# Patient Record
Sex: Female | Born: 1985 | Hispanic: No | Marital: Single | Smoking: Former smoker
Health system: Southern US, Community
[De-identification: ages and names within clinical notes are randomized; demographics above are authoritative.]

## PROBLEM LIST (undated history)

## (undated) ENCOUNTER — Inpatient Hospital Stay (HOSPITAL_COMMUNITY): Payer: Self-pay

## (undated) DIAGNOSIS — Z915 Personal history of self-harm: Secondary | ICD-10-CM

## (undated) DIAGNOSIS — F329 Major depressive disorder, single episode, unspecified: Secondary | ICD-10-CM

## (undated) DIAGNOSIS — E079 Disorder of thyroid, unspecified: Secondary | ICD-10-CM

## (undated) DIAGNOSIS — G43909 Migraine, unspecified, not intractable, without status migrainosus: Secondary | ICD-10-CM

## (undated) DIAGNOSIS — K219 Gastro-esophageal reflux disease without esophagitis: Secondary | ICD-10-CM

## (undated) DIAGNOSIS — Z9151 Personal history of suicidal behavior: Secondary | ICD-10-CM

## (undated) DIAGNOSIS — N83209 Unspecified ovarian cyst, unspecified side: Secondary | ICD-10-CM

## (undated) DIAGNOSIS — IMO0002 Reserved for concepts with insufficient information to code with codable children: Secondary | ICD-10-CM

## (undated) DIAGNOSIS — F419 Anxiety disorder, unspecified: Secondary | ICD-10-CM

## (undated) DIAGNOSIS — F32A Depression, unspecified: Secondary | ICD-10-CM

## (undated) DIAGNOSIS — F909 Attention-deficit hyperactivity disorder, unspecified type: Secondary | ICD-10-CM

## (undated) DIAGNOSIS — F319 Bipolar disorder, unspecified: Secondary | ICD-10-CM

## (undated) DIAGNOSIS — F99 Mental disorder, not otherwise specified: Secondary | ICD-10-CM

## (undated) HISTORY — DX: Attention-deficit hyperactivity disorder, unspecified type: F90.9

## (undated) HISTORY — DX: Personal history of suicidal behavior: Z91.51

## (undated) HISTORY — DX: Personal history of self-harm: Z91.5

## (undated) HISTORY — DX: Migraine, unspecified, not intractable, without status migrainosus: G43.909

## (undated) HISTORY — DX: Mental disorder, not otherwise specified: F99

## (undated) HISTORY — PX: BRAIN SURGERY: SHX531

## (undated) HISTORY — PX: OTHER SURGICAL HISTORY: SHX169

---
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
## 2005-01-12 ENCOUNTER — Emergency Department (HOSPITAL_COMMUNITY): Admission: EM | Admit: 2005-01-12 | Discharge: 2005-01-12 | Payer: Self-pay | Admitting: Emergency Medicine

## 2005-01-15 ENCOUNTER — Emergency Department (HOSPITAL_COMMUNITY): Admission: EM | Admit: 2005-01-15 | Discharge: 2005-01-15 | Payer: Self-pay | Admitting: Emergency Medicine

## 2005-04-20 ENCOUNTER — Ambulatory Visit (HOSPITAL_COMMUNITY): Admission: RE | Admit: 2005-04-20 | Discharge: 2005-04-20 | Payer: Self-pay | Admitting: Obstetrics and Gynecology

## 2005-06-02 ENCOUNTER — Inpatient Hospital Stay (HOSPITAL_COMMUNITY): Admission: AD | Admit: 2005-06-02 | Discharge: 2005-06-04 | Payer: Self-pay | Admitting: Obstetrics & Gynecology

## 2005-09-04 ENCOUNTER — Emergency Department (HOSPITAL_COMMUNITY): Admission: EM | Admit: 2005-09-04 | Discharge: 2005-09-05 | Payer: Self-pay | Admitting: Emergency Medicine

## 2005-10-31 ENCOUNTER — Emergency Department (HOSPITAL_COMMUNITY): Admission: EM | Admit: 2005-10-31 | Discharge: 2005-10-31 | Payer: Self-pay | Admitting: Emergency Medicine

## 2006-07-13 ENCOUNTER — Emergency Department (HOSPITAL_COMMUNITY): Admission: EM | Admit: 2006-07-13 | Discharge: 2006-07-13 | Payer: Self-pay | Admitting: Emergency Medicine

## 2006-12-13 ENCOUNTER — Emergency Department (HOSPITAL_COMMUNITY): Admission: EM | Admit: 2006-12-13 | Discharge: 2006-12-14 | Payer: Self-pay | Admitting: Emergency Medicine

## 2006-12-22 ENCOUNTER — Emergency Department (HOSPITAL_COMMUNITY): Admission: EM | Admit: 2006-12-22 | Discharge: 2006-12-22 | Payer: Self-pay | Admitting: Emergency Medicine

## 2007-01-11 ENCOUNTER — Emergency Department (HOSPITAL_COMMUNITY): Admission: EM | Admit: 2007-01-11 | Discharge: 2007-01-11 | Payer: Self-pay | Admitting: Emergency Medicine

## 2007-01-13 ENCOUNTER — Emergency Department (HOSPITAL_COMMUNITY): Admission: EM | Admit: 2007-01-13 | Discharge: 2007-01-13 | Payer: Self-pay | Admitting: Emergency Medicine

## 2007-04-24 ENCOUNTER — Emergency Department (HOSPITAL_COMMUNITY): Admission: EM | Admit: 2007-04-24 | Discharge: 2007-04-24 | Payer: Self-pay | Admitting: Emergency Medicine

## 2007-04-25 ENCOUNTER — Emergency Department (HOSPITAL_COMMUNITY): Admission: EM | Admit: 2007-04-25 | Discharge: 2007-04-25 | Payer: Self-pay | Admitting: Emergency Medicine

## 2007-08-23 ENCOUNTER — Emergency Department (HOSPITAL_COMMUNITY): Admission: EM | Admit: 2007-08-23 | Discharge: 2007-08-23 | Payer: Self-pay | Admitting: Emergency Medicine

## 2007-08-27 ENCOUNTER — Emergency Department (HOSPITAL_COMMUNITY): Admission: EM | Admit: 2007-08-27 | Discharge: 2007-08-27 | Payer: Self-pay | Admitting: *Deleted

## 2007-09-10 ENCOUNTER — Emergency Department (HOSPITAL_COMMUNITY): Admission: EM | Admit: 2007-09-10 | Discharge: 2007-09-10 | Payer: Self-pay | Admitting: Emergency Medicine

## 2007-09-13 ENCOUNTER — Emergency Department (HOSPITAL_COMMUNITY): Admission: EM | Admit: 2007-09-13 | Discharge: 2007-09-13 | Payer: Self-pay | Admitting: Emergency Medicine

## 2007-12-31 ENCOUNTER — Emergency Department (HOSPITAL_COMMUNITY): Admission: EM | Admit: 2007-12-31 | Discharge: 2007-12-31 | Payer: Self-pay | Admitting: Emergency Medicine

## 2008-01-14 ENCOUNTER — Emergency Department (HOSPITAL_COMMUNITY): Admission: EM | Admit: 2008-01-14 | Discharge: 2008-01-14 | Payer: Self-pay | Admitting: Emergency Medicine

## 2008-02-20 ENCOUNTER — Inpatient Hospital Stay (HOSPITAL_COMMUNITY): Admission: AD | Admit: 2008-02-20 | Discharge: 2008-02-20 | Payer: Self-pay | Admitting: Obstetrics & Gynecology

## 2008-02-27 ENCOUNTER — Inpatient Hospital Stay (HOSPITAL_COMMUNITY): Admission: AD | Admit: 2008-02-27 | Discharge: 2008-02-27 | Payer: Self-pay | Admitting: Family Medicine

## 2008-08-06 ENCOUNTER — Emergency Department (HOSPITAL_COMMUNITY): Admission: EM | Admit: 2008-08-06 | Discharge: 2008-08-07 | Payer: Self-pay | Admitting: Emergency Medicine

## 2008-09-15 IMAGING — US US PELVIS COMPLETE MODIFY
1 series · 14 of 25 positions shown · non-contrast
Comparison: none

CLINICAL DATA: Pelvic and abdominal pain.
 TRANSABDOMINAL AND TRANSVAGINAL PELVIC ULTRASOUND:
TECHNIQUE: Both transabdominal and transvaginal ultrasound examinations of the pelvis were performed including evaluation of the uterus, ovaries, adnexal regions, and pelvic cul-de-sac.

[Series 1: unknown · 0.30mm/px · 14 of 65 slices shown]
[im 1/65]
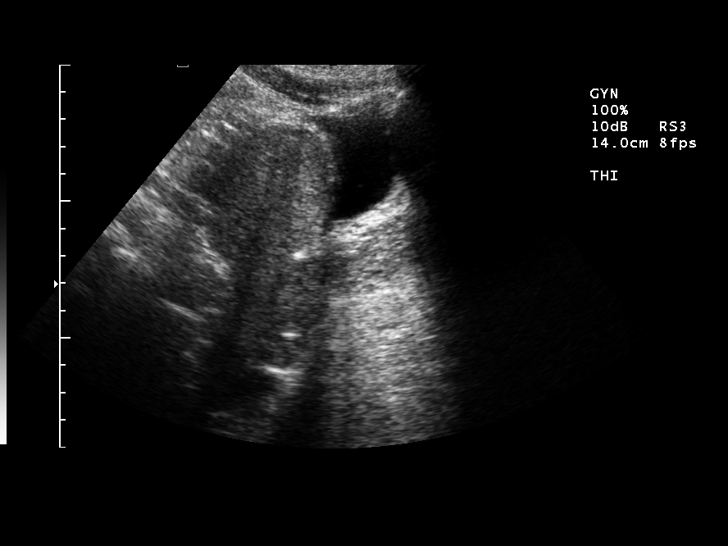
[im 6/65]
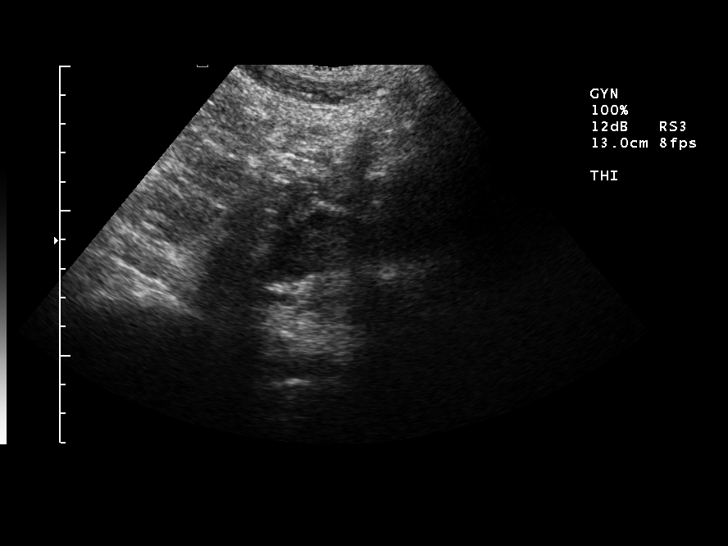
[im 11/65]
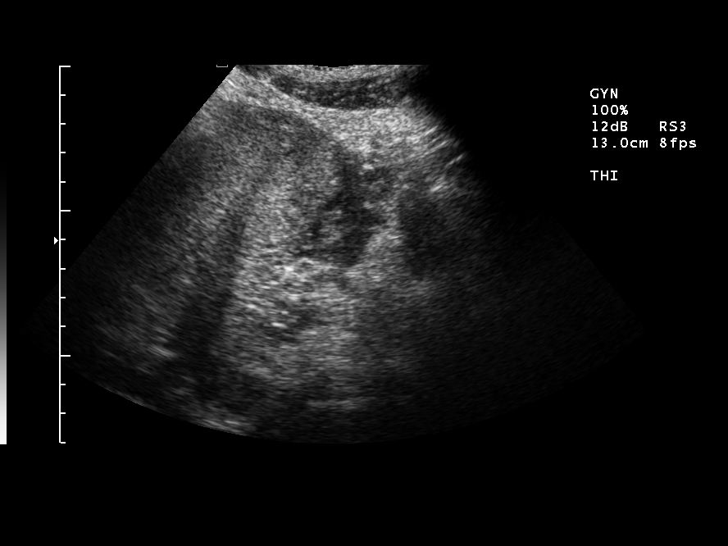
[im 17/65]
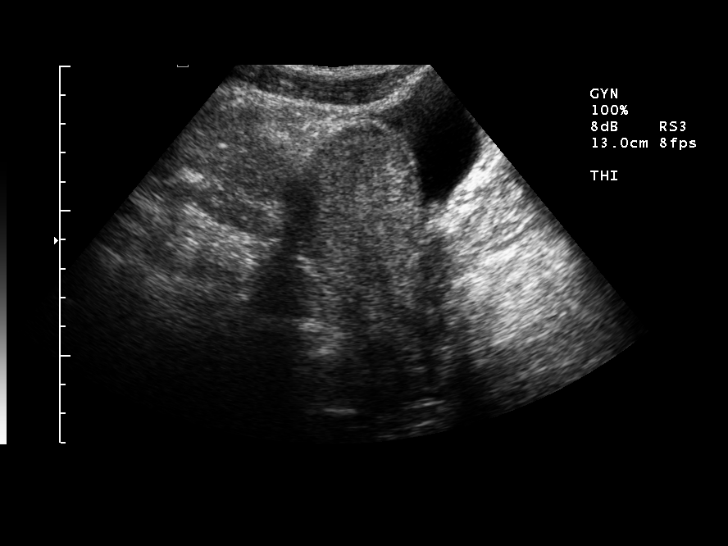
[im 22/65]
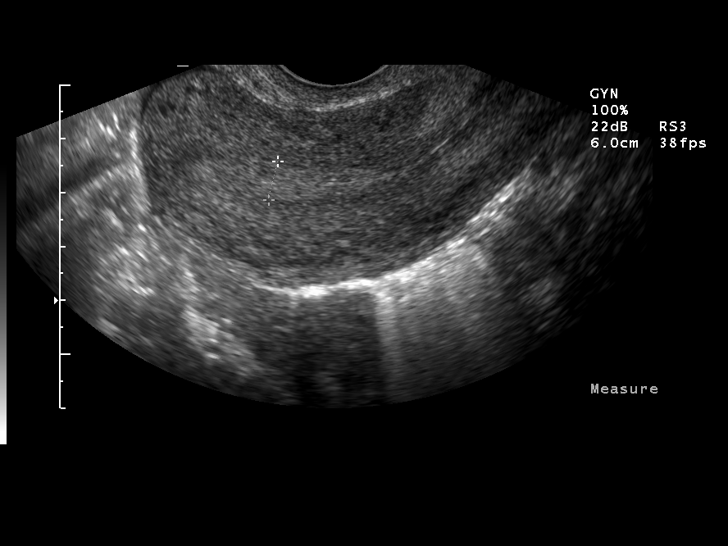
[im 25/65]
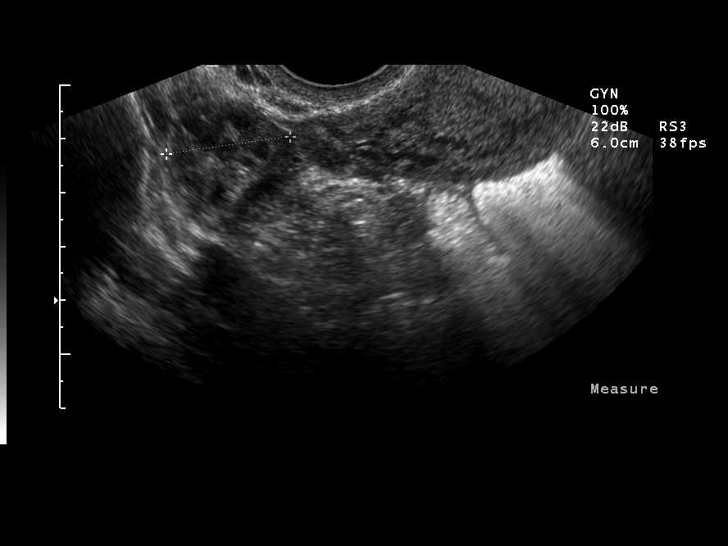
[im 30/65]
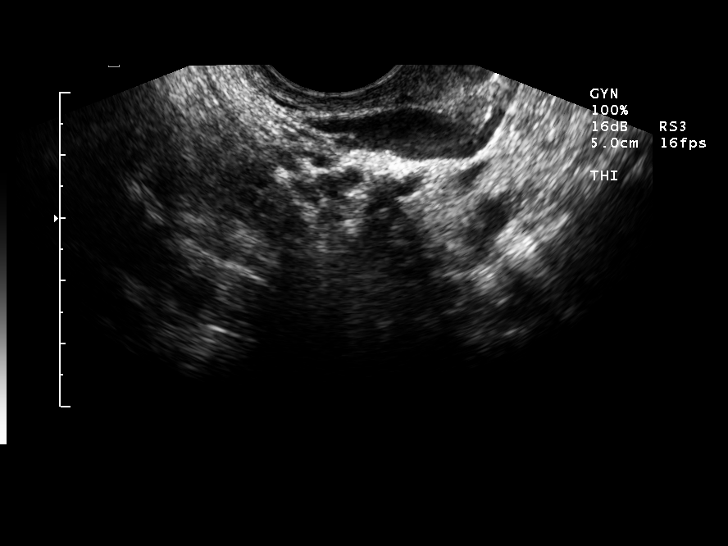
[im 35/65]
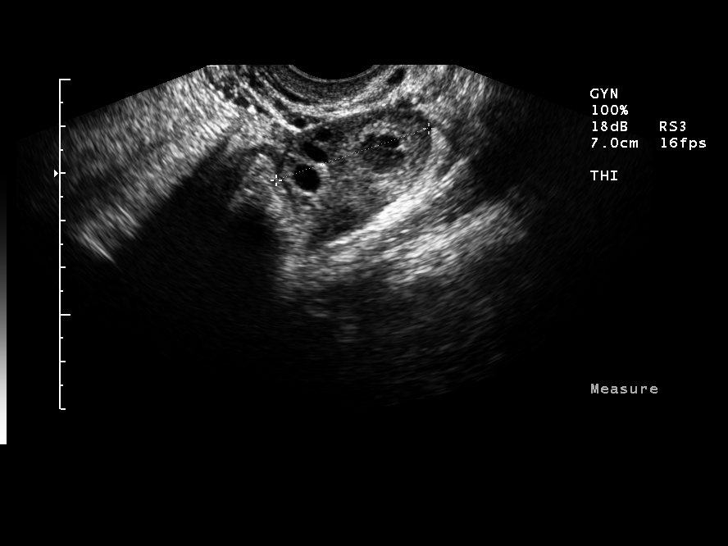
[im 41/65]
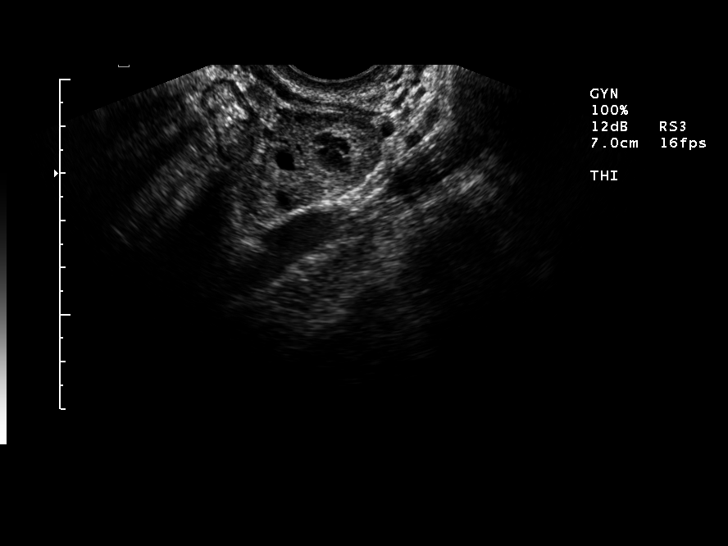
[im 43/65]
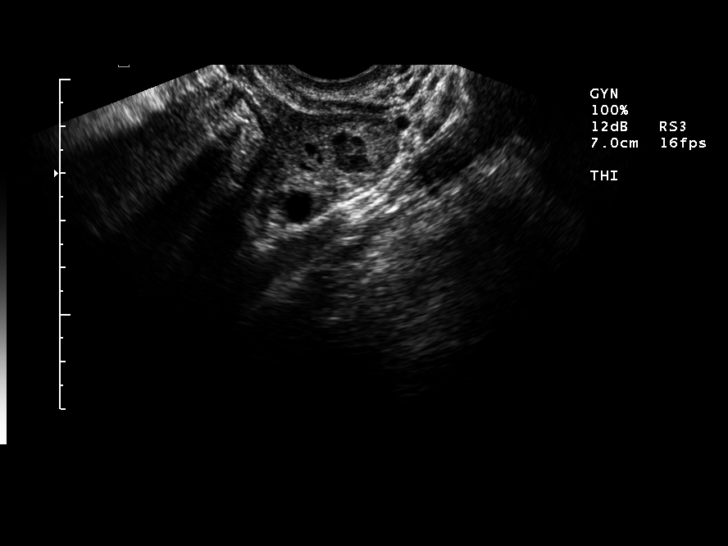
[im 49/65]
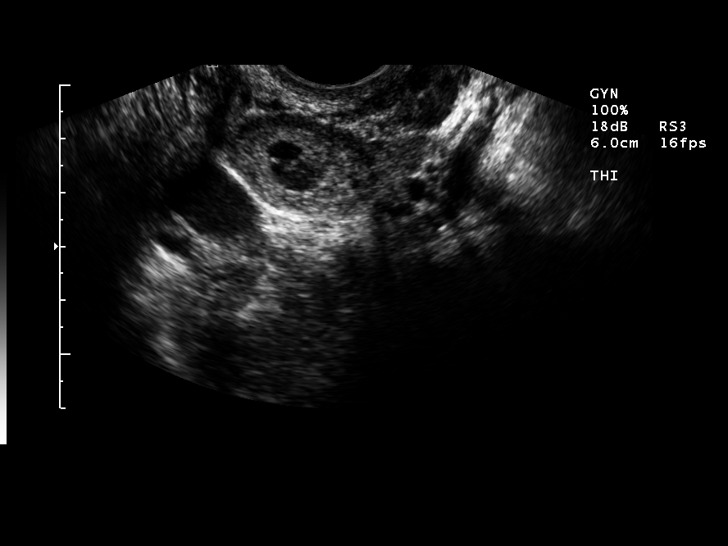
[im 54/65]
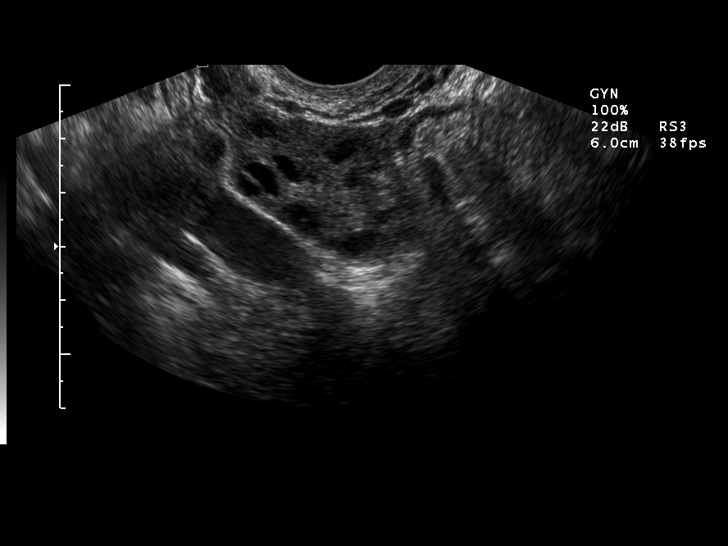
[im 59/65]
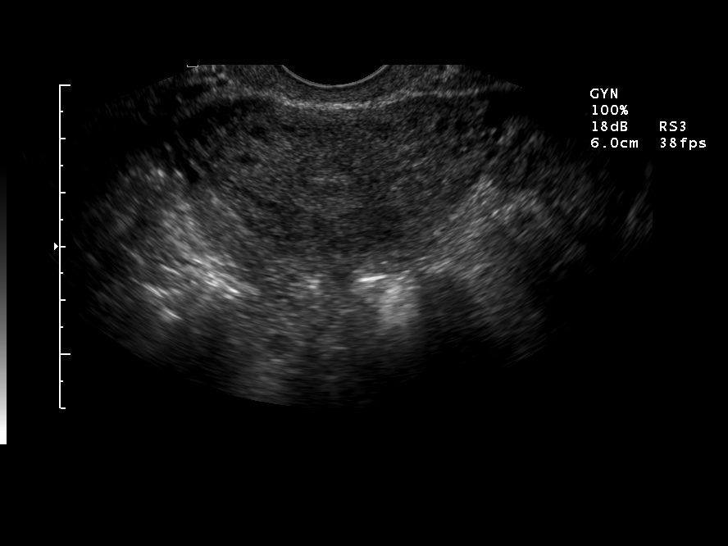
[im 65/65]
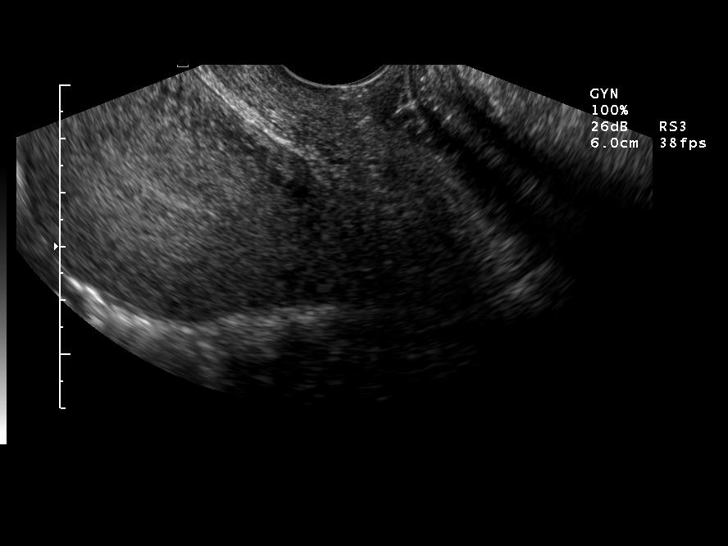

[14 of 25 positions shown; findings below may reference images not displayed]

FINDINGS: Uterus measures 6 x 7.5 x 4.2 cm.  Endometrial stripe measures 7 mm.  Normal morphology of the right ovary measuring 2.3 x 2.8 x 1.7 cm with multiple small follicles.  Normal morphology of the left ovary with small follicles. The left ovary measures 3.4 x 4.1 x 2.1 cm.  There is a slightly complex conglomeration of follicles or cysts in the left ovary measuring up to 1.4 cm.  There is also small to moderate amount of fluid in the cul-de-sac.  Normal echotexture of the uterus. By report, the patient has a negative pregnancy test.
IMPRESSION: 1.  Moderate amount of free fluid in the cul-de-sac. 
 2.  Normal appearance of the uterus and adnexal structures. However, there is a small complex cyst or follicle in the left ovary measuring up to 1.4 cm. This could be followed up in 8 weeks to insure resolution.

## 2008-09-29 ENCOUNTER — Emergency Department (HOSPITAL_COMMUNITY): Admission: EM | Admit: 2008-09-29 | Discharge: 2008-09-29 | Payer: Self-pay | Admitting: Emergency Medicine

## 2008-12-22 ENCOUNTER — Emergency Department (HOSPITAL_COMMUNITY): Admission: EM | Admit: 2008-12-22 | Discharge: 2008-12-23 | Payer: Self-pay | Admitting: Emergency Medicine

## 2008-12-25 ENCOUNTER — Emergency Department (HOSPITAL_COMMUNITY): Admission: EM | Admit: 2008-12-25 | Discharge: 2008-12-25 | Payer: Self-pay | Admitting: Emergency Medicine

## 2009-10-05 ENCOUNTER — Emergency Department (HOSPITAL_COMMUNITY): Admission: EM | Admit: 2009-10-05 | Discharge: 2009-10-05 | Payer: Self-pay | Admitting: Emergency Medicine

## 2009-10-11 ENCOUNTER — Emergency Department (HOSPITAL_COMMUNITY): Admission: EM | Admit: 2009-10-11 | Discharge: 2009-10-11 | Payer: Self-pay | Admitting: Emergency Medicine

## 2009-11-21 ENCOUNTER — Emergency Department (HOSPITAL_COMMUNITY): Admission: EM | Admit: 2009-11-21 | Discharge: 2009-11-21 | Payer: Self-pay | Admitting: Emergency Medicine

## 2009-12-29 ENCOUNTER — Inpatient Hospital Stay (HOSPITAL_COMMUNITY): Admission: AD | Admit: 2009-12-29 | Discharge: 2009-12-29 | Payer: Self-pay | Admitting: Obstetrics and Gynecology

## 2009-12-29 ENCOUNTER — Ambulatory Visit: Payer: Self-pay | Admitting: Obstetrics and Gynecology

## 2010-01-24 ENCOUNTER — Emergency Department (HOSPITAL_COMMUNITY): Admission: EM | Admit: 2010-01-24 | Discharge: 2010-01-24 | Payer: Self-pay | Admitting: Emergency Medicine

## 2010-04-10 ENCOUNTER — Encounter: Admission: RE | Admit: 2010-04-10 | Discharge: 2010-05-08 | Payer: Self-pay | Admitting: Obstetrics & Gynecology

## 2010-06-02 ENCOUNTER — Inpatient Hospital Stay (HOSPITAL_COMMUNITY): Admission: AD | Admit: 2010-06-02 | Discharge: 2010-06-02 | Payer: Self-pay | Admitting: Obstetrics & Gynecology

## 2010-06-09 ENCOUNTER — Inpatient Hospital Stay (HOSPITAL_COMMUNITY): Admission: AD | Admit: 2010-06-09 | Discharge: 2010-06-11 | Payer: Self-pay | Admitting: Obstetrics & Gynecology

## 2010-10-05 NOTE — L&D Delivery Note (Addendum)
Delivery Note At 3:13 PM a viable female was delivered via Vaginal, Spontaneous Delivery (Presentation: Right Occiput Anterior).  APGAR: 2, 6, 9; weight 6 pounds 15 ounces.   Placenta status: Intact, Spontaneous.  Cord: 3 vessels with the following complications: Infant born with poor tone.  Primary resuscitation started immediately with vigorous rubbing, nasal and oral suction with bulb.  No nuchal cords present.  Code APGAR called. See neonatology note.   Anesthesia: Epidural  Episiotomy: None Lacerations: None Est. Blood Loss (mL): 300 mL  Mom to postpartum.  Baby to nursery-stable.  Joselyn Edling JEHIEL 09/11/2011, 3:38 PM

## 2010-12-15 ENCOUNTER — Ambulatory Visit (HOSPITAL_COMMUNITY)
Admission: RE | Admit: 2010-12-15 | Discharge: 2010-12-15 | Disposition: A | Discharge status: Home / Self Care | Payer: Self-pay | Attending: Psychiatry | Admitting: Psychiatry

## 2010-12-15 DIAGNOSIS — F1120 Opioid dependence, uncomplicated: Secondary | ICD-10-CM | POA: Insufficient documentation

## 2010-12-16 ENCOUNTER — Emergency Department (HOSPITAL_COMMUNITY)
Admission: EM | Admit: 2010-12-16 | Discharge: 2010-12-17 | Disposition: A | Discharge status: Rehabilitation Facility | Payer: Self-pay | Attending: Emergency Medicine | Admitting: Emergency Medicine

## 2010-12-16 DIAGNOSIS — F988 Other specified behavioral and emotional disorders with onset usually occurring in childhood and adolescence: Secondary | ICD-10-CM | POA: Insufficient documentation

## 2010-12-16 DIAGNOSIS — F329 Major depressive disorder, single episode, unspecified: Secondary | ICD-10-CM | POA: Insufficient documentation

## 2010-12-16 DIAGNOSIS — F1910 Other psychoactive substance abuse, uncomplicated: Secondary | ICD-10-CM | POA: Insufficient documentation

## 2010-12-16 DIAGNOSIS — F3289 Other specified depressive episodes: Secondary | ICD-10-CM | POA: Insufficient documentation

## 2010-12-16 DIAGNOSIS — F1210 Cannabis abuse, uncomplicated: Secondary | ICD-10-CM | POA: Insufficient documentation

## 2010-12-16 DIAGNOSIS — F1110 Opioid abuse, uncomplicated: Secondary | ICD-10-CM | POA: Insufficient documentation

## 2010-12-16 LAB — URINE MICROSCOPIC-ADD ON

## 2010-12-16 LAB — CBC
HCT: 42.8 % (ref 36.0–46.0)
Hemoglobin: 14.8 g/dL (ref 12.0–15.0)
MCH: 32.6 pg (ref 26.0–34.0)
MCHC: 34.6 g/dL (ref 30.0–36.0)
MCV: 94.3 fL (ref 78.0–100.0)
RDW: 13 % (ref 11.5–15.5)

## 2010-12-16 LAB — COMPREHENSIVE METABOLIC PANEL
BUN: 7 mg/dL (ref 6–23)
CO2: 29 mEq/L (ref 19–32)
Calcium: 9 mg/dL (ref 8.4–10.5)
Creatinine, Ser: 0.72 mg/dL (ref 0.4–1.2)
GFR calc non Af Amer: >60 mL/min (ref >60)
Glucose, Bld: 97 mg/dL (ref 70–99)
Total Protein: 7.1 g/dL (ref 6.0–8.3)

## 2010-12-16 LAB — DIFFERENTIAL
Basophils Absolute: 0 10*3/uL (ref 0.0–0.1)
Eosinophils Relative: 0 % (ref 0–5)
Lymphocytes Relative: 23 % (ref 12–46)
Monocytes Absolute: 1.3 10*3/uL — ABNORMAL HIGH (ref 0.1–1.0)
Monocytes Relative: 10 % (ref 3–12)

## 2010-12-16 LAB — RAPID URINE DRUG SCREEN, HOSP PERFORMED
Cocaine: NOT DETECTED
Opiates: POSITIVE — AB
Tetrahydrocannabinol: POSITIVE — AB

## 2010-12-16 LAB — POCT PREGNANCY, URINE: Preg Test, Ur: NEGATIVE

## 2010-12-16 LAB — URINALYSIS, ROUTINE W REFLEX MICROSCOPIC
Glucose, UA: NEGATIVE mg/dL
Leukocytes, UA: NEGATIVE
Nitrite: NEGATIVE
Specific Gravity, Urine: 1.036 — ABNORMAL HIGH (ref 1.005–1.030)
pH: 6 (ref 5.0–8.0)

## 2010-12-16 LAB — ETHANOL: Alcohol, Ethyl (B): <5 mg/dL (ref 0–10)

## 2010-12-17 DIAGNOSIS — F1910 Other psychoactive substance abuse, uncomplicated: Secondary | ICD-10-CM

## 2010-12-18 LAB — CBC
Hemoglobin: 11 g/dL — ABNORMAL LOW (ref 12.0–15.0)
MCH: 31 pg (ref 26.0–34.0)
MCHC: 34.2 g/dL (ref 30.0–36.0)
MCHC: 34.3 g/dL (ref 30.0–36.0)
Platelets: 332 10*3/uL (ref 150–400)
RBC: 3.54 MIL/uL — ABNORMAL LOW (ref 3.87–5.11)
RDW: 13 % (ref 11.5–15.5)
WBC: 20.2 10*3/uL — ABNORMAL HIGH (ref 4.0–10.5)

## 2010-12-18 LAB — RPR: RPR Ser Ql: NONREACTIVE

## 2010-12-23 LAB — URINE CULTURE: Colony Count: >=100000

## 2010-12-23 LAB — URINALYSIS, ROUTINE W REFLEX MICROSCOPIC
Ketones, ur: NEGATIVE mg/dL
Nitrite: POSITIVE — AB
Protein, ur: NEGATIVE mg/dL
Urobilinogen, UA: 0.2 mg/dL (ref 0.0–1.0)

## 2010-12-23 LAB — URINE MICROSCOPIC-ADD ON

## 2010-12-23 LAB — PREGNANCY, URINE: Preg Test, Ur: POSITIVE

## 2010-12-24 LAB — DIFFERENTIAL
Basophils Absolute: 0 10*3/uL (ref 0.0–0.1)
Basophils Relative: 0 % (ref 0–1)
Eosinophils Absolute: 0 K/uL (ref 0.0–0.7)
Eosinophils Relative: 0 % (ref 0–5)
Lymphocytes Relative: 19 % (ref 12–46)
Lymphs Abs: 2.7 K/uL (ref 0.7–4.0)
Monocytes Absolute: 0.7 K/uL (ref 0.1–1.0)
Monocytes Relative: 5 % (ref 3–12)
Neutro Abs: 11 10*3/uL — ABNORMAL HIGH (ref 1.7–7.7)
Neutrophils Relative %: 76 % (ref 43–77)

## 2010-12-24 LAB — CBC
HCT: 38.5 % (ref 36.0–46.0)
Hemoglobin: 13.6 g/dL (ref 12.0–15.0)
MCHC: 35.4 g/dL (ref 30.0–36.0)
MCV: 96.8 fL (ref 78.0–100.0)
Platelets: 307 10*3/uL (ref 150–400)
RBC: 3.98 MIL/uL (ref 3.87–5.11)
RDW: 12.6 % (ref 11.5–15.5)
WBC: 14.4 K/uL — ABNORMAL HIGH (ref 4.0–10.5)

## 2010-12-24 LAB — BASIC METABOLIC PANEL
CO2: 22 mEq/L (ref 19–32)
Calcium: 8.9 mg/dL (ref 8.4–10.5)
Creatinine, Ser: 0.48 mg/dL (ref 0.4–1.2)
Glucose, Bld: 85 mg/dL (ref 70–99)

## 2010-12-24 LAB — BASIC METABOLIC PANEL WITH GFR
BUN: 8 mg/dL (ref 6–23)
Chloride: 105 meq/L (ref 96–112)
GFR calc Af Amer: >60 mL/min (ref >60)
GFR calc non Af Amer: >60 mL/min (ref >60)
Potassium: 3.6 meq/L (ref 3.5–5.1)
Sodium: 134 meq/L — ABNORMAL LOW (ref 135–145)

## 2010-12-24 LAB — URINALYSIS, ROUTINE W REFLEX MICROSCOPIC
Bilirubin Urine: NEGATIVE
Glucose, UA: NEGATIVE mg/dL
Hgb urine dipstick: NEGATIVE
Ketones, ur: 40 mg/dL — AB
Nitrite: NEGATIVE
Protein, ur: NEGATIVE mg/dL
Specific Gravity, Urine: 1.017 (ref 1.005–1.030)
Urobilinogen, UA: 0.2 mg/dL (ref 0.0–1.0)
pH: 6 (ref 5.0–8.0)

## 2010-12-24 LAB — HCG, QUANTITATIVE, PREGNANCY: hCG, Beta Chain, Quant, S: 63157 m[IU]/mL — ABNORMAL HIGH (ref <5)

## 2010-12-28 LAB — WET PREP, GENITAL: Trich, Wet Prep: NONE SEEN

## 2010-12-28 LAB — URINALYSIS, ROUTINE W REFLEX MICROSCOPIC
Glucose, UA: NEGATIVE mg/dL
Ketones, ur: NEGATIVE mg/dL
Protein, ur: NEGATIVE mg/dL

## 2011-02-20 NOTE — H&P (Signed)
[facility]  Patient:    RICCI, PAFF                   MRN: [id number] Adm. Date:  [id number] Attending:  Veneta Penton                   Psychiatric Admission Assessment  DATE OF ADMISSION:  October 12, 2000  REASON FOR ADMISSION:  This 25 year old, white female was admitted from the [facility] Pediatric Unit, status post phenobarbital overdose as a suicide attempt.  HISTORY OF PRESENT ILLNESS:  This is the second overdose of this patient with dogs phenobarbital.  The last episode was approximately two months ago.  At that time she also attempted to harm herself by jumping out a two-story window and succeeded in fracturing her foot.  She admits to an increasing depressed, irritable and angry mood over the past year along with decreased school performance, anhedonia, insomnia, decreased concentration and energy level, increased fatigue, decreased appetite, weight loss, feelings of hopelessness, helplessness, worthlessness, recurrent thoughts of death, and at the present time she reports that she will contract for safety.  She reports that her current psychosocial stressors include multiple arguments with her mother and father over various subjects.  PAST PSYCHIATRIC HISTORY:  The patient has no previous history of psychiatric treatment.  She does admit to a history of cannabis abuse and states that she has been using cannabis on a weekly basis over the past year.  She denies any use of alcohol or other street drugs.  PAST MEDICAL/SURGICAL HISTORY:  She has no past medical history of medical or surgical problems.  ALLERGIES:  She has no known drug allergies or sensitivities.  CURRENT MEDICATIONS:  Depo-Provera injections which she gets every three months for birth control.  FAMILY AND SOCIAL HISTORY:  The patient is currently in the ninth grade.  She lives with her mother and father.  She is failing in school.  She denies any family  history of psychiatric illness.  STRENGTHS AND ASSETS:  Her mother and father are supportive of her.  MENTAL STATUS EXAMINATION:  The patient presents as a disheveled, unkempt, well-developed, well-nourished, adolescent white female, who is psychomotor retarded with a furrowed brow and whose appearance is compatible with her stated age.  She displays poor impulse control, decreased concentration.  Her affect and mood are depressed, irritable and angry.  Her speech is coherent with a decreased rate in volume and speech, increased speech latency. He displays no looseness of associations or phonemic errors.  She is oppositional and defiant.  Her immediate recall, short-term memory and remote memory are intact.  Her thought processes are goal directed.  ADMISSION DIAGNOSES:  Diagnoses according to DSM-IV: Axis I:    1. Major depression, single episode, severe without               psychosis.            2. Cannabis abuse, rule out dependence.            3. Oppositional defiant disorder.            4. Rule out conduct disorder. Axis II:   1. Borderline histrionic traits.            2. Rule out personality disorder. Axis III:  Status post overdose with phenobarbital. Axis IV:   Current psychosocial stressors are severe. Axis V:    Code 20.  ESTIMATED LENGTH OF STAY:  The estimated  length of stay for the patient on the inpatient unit is three to five days.  INITIALLY DISCHARGE PLAN:  To discharge the patient to home.  INITIAL PLAN OF CARE:  To begin the patient on a trial of Celexa once informed consent is obtained.  Psychotherapy will focus on decreasing the patients potential for harm to self and others, increasing her activities of daily living, decrease in cognitive distortions.  A laboratory work-up will also be initiated to rule out any medical problems contributing to her symptomatology. D:  10/13/00 TD:  10/13/00 Job: [id number]

## 2011-02-20 NOTE — Discharge Summary (Signed)
NAMEJULITA, Caitlin Berger            ACCOUNT NO.:  [id number]   MEDICAL RECORD NO.:  [id number]          PATIENT TYPE:  INP   LOCATION:  9101                          FACILITY:  [facility]   PHYSICIAN:  Gerrit Friends. Aldona Bar, M.D.   DATE OF BIRTH:  05-23-1986   DATE OF ADMISSION:  06/02/2005  DATE OF DISCHARGE:                                 DISCHARGE SUMMARY   DISCHARGE DIAGNOSES:  1.  Term pregnancy, delivered, 6-pound 1-ounce female infant, Apgars 9 and      9.  2.  Blood type A positive.   PROCEDURES:  1.  Normal spontaneous delivery.  2.  Labial tear - no repair.   SUMMARY:  This 25 year old gravida 2 para 0 was admitted in active labor  after an uncomplicated pregnancy. She presented almost completely dilated  and subsequently had a normal spontaneous delivery of a viable 6-pound 1-  ounce female infant with Apgar hours of 9 and 9 over intact perineum. There  was a small right labial tear which did not require repair. Her postpartum  course was benign. Her discharge hemoglobin 10.5 with a white count of  19,100 and a  platelet count pertinent for 303,000. On the morning of June 04, 2005 she was ambulating well, tolerating a regular diet well, having  normal bowel and bladder function. was afebrile. She was bottle feeding  without difficulty. She was comfortable, tolerating a regular diet well, and  was anxious for discharge. Accordingly, she was given all appropriate  instructions per discharge brochure and understood all instructions well.  Discharge medications include vitamins one a day until she is finished with  them, ferrous sulfate three to four times a week, Tylox one to two q.4-6h.  as needed for severe pain, and Motrin 600 mg every 6 hours for less severe  pain. She will return to the office for follow-up in approximately four  weeks' time.   CONDITION ON DISCHARGE:  Improved.      Gerrit Friends. Aldona Bar, M.D.  Electronically Signed     RMW/MEDQ  D:  06/04/2005  T:   06/04/2005  Job:  [id number]

## 2011-02-20 NOTE — Discharge Summary (Signed)
[facility]  Patient:    Caitlin Berger, Caitlin Berger                   MRN: [id number] Adm. Date:  [id number] Disc. Date: 10/14/00 Attending:  Veneta Penton                           Discharge Summary  REASON FOR ADMISSION:  This 25 year old white female was admitted from the [facility] Pediatric Unit status post phenobarbital overdose as a suicide attempt.  For further history of present illness, please see the patients psychiatry admission assessment.  PHYSICAL EXAMINATION AT THE TIME OF ADMISSION:  Entirely unremarkable.  LABORATORY EXAMINATION:  Predominantly done on the Pediatric Unit at [facility].  Further laboratory evaluation showed an RPR nonreactive, thyroid function tests were within normal limits, urine probe for gonorrhea and chlamydia was pending at the time of discharge.  The patient received no x-rays, no special procedures, no additional consultations during the course of this hospitalization.  She sustained no complications during the hospital course.  HOSPITAL COURSE:  The patient rapidly adapted to unit routine, socializing well with both patients and staff.  She continued to admit to depression and recurrent thoughts of death.  In the past 24 hours, she has denied any homicidal or suicidal ideation.  She has continued to display and excessive reliance on borderline and histrionic traits but they are not significant enough to constitute a personality disorder.  She was begun on a trial of Celexa and has tolerated the medication without side effects.  She is much less psychomotor retarded.  Activities of daily living have improved.  She no longer is disheveled and unkempt.  Affect and mood have improved.  She is participating in all aspects of the therapeutic treatment program and her oppositional and defiant behavior have decreased.  She is showing increased impulse control and is felt to no longer be a danger to herself or  others. Consequently, it is felt that the patient is ready for discharge to a less restrictive alternative setting.  Due to the volatile nature of her home environment, it is felt that she would be best served by being transitioned into an intensive outpatient program to continue to assess her family situation.  CONDITION ON DISCHARGE:  Improved.  DIAGNOSES: Axis I:    1. Major depression, single episode, severe without psychosis.            2. Cannabis abuse, rule out dependence.            3. Oppositional defiant disorder, rule out conduct disorder. Axis II:   1. Borderline and histrionic traits.            2. Rule out personality disorder. Axis III:  Status post phenobarbital overdose. Axis IV:   Current psychosocial stressors are severe. Axis V:    20 on admission, 30 on discharge.  FURTHER EVALUATION AND TREATMENT RECOMMENDATIONS: 1. The patient is discharged to the custody of her parents. 2. The patient is discharged on a unrestricted level of activity and a regular    diet. 3. The patient is discharged on Celexa 20 mg p.o. q.d. 4. The patient is discharge to follow up in the intensive outpatient program    at [facility] where she will follow up with Dr. Carolanne Grumbling for all    further aspects of his mental health care and consequently, I will sign off    on  the case at this time. DD:  10/14/00 TD:  10/14/00 Job: [id number]

## 2011-04-11 ENCOUNTER — Inpatient Hospital Stay (HOSPITAL_COMMUNITY): Payer: Self-pay | Admitting: Family Medicine

## 2011-04-11 ENCOUNTER — Encounter (HOSPITAL_COMMUNITY): Payer: Self-pay

## 2011-04-11 ENCOUNTER — Inpatient Hospital Stay (HOSPITAL_COMMUNITY)
Admission: AD | Admit: 2011-04-11 | Discharge: 2011-04-11 | Discharge status: Against Medical Advice | Payer: Self-pay | Source: Ambulatory Visit | Attending: Family Medicine | Admitting: Family Medicine

## 2011-04-11 ENCOUNTER — Inpatient Hospital Stay (HOSPITAL_COMMUNITY): Admission: AD | Admit: 2011-04-11 | Payer: Self-pay | Source: Ambulatory Visit | Admitting: Family Medicine

## 2011-04-11 DIAGNOSIS — R109 Unspecified abdominal pain: Secondary | ICD-10-CM | POA: Insufficient documentation

## 2011-04-11 HISTORY — DX: Unspecified ovarian cyst, unspecified side: N83.209

## 2011-04-11 LAB — URINALYSIS, ROUTINE W REFLEX MICROSCOPIC
Ketones, ur: NEGATIVE mg/dL
Nitrite: NEGATIVE
Protein, ur: NEGATIVE mg/dL
Urobilinogen, UA: 0.2 mg/dL (ref 0.0–1.0)
pH: 6 (ref 5.0–8.0)

## 2011-04-11 LAB — POCT PREGNANCY, URINE: Preg Test, Ur: POSITIVE

## 2011-04-11 NOTE — Initial Assessments (Signed)
Had some positive UPT at home, then some neg, didn't know what was going on.  Has had some intermittent cramping, no bleeding or d/c.  Artelia Laroche CNM on unit and notified.

## 2011-04-11 NOTE — Initial Assessments (Signed)
Pt stated she was unable to stay for furhter testing due to baby sitting emergency. M.Williams,CNM notified and pt told she could return at anytime for reeval. Pt verbalzied undertanding and singed AMA form.

## 2011-06-09 ENCOUNTER — Other Ambulatory Visit: Payer: Self-pay | Admitting: Family Medicine

## 2011-06-09 DIAGNOSIS — Z3689 Encounter for other specified antenatal screening: Secondary | ICD-10-CM

## 2011-06-09 LAB — GC/CHLAMYDIA PROBE AMP, GENITAL
Chlamydia: NEGATIVE
Gonorrhea: NEGATIVE
Gonorrhea: NEGATIVE

## 2011-06-09 LAB — CBC: Platelets: 326 10*3/uL (ref 150–399)

## 2011-06-09 LAB — CYTOLOGY - PAP
CYTOLOGY - PAP: NEGATIVE
Glucose, 1 Hour GTT: 94

## 2011-06-09 LAB — RPR: RPR: NONREACTIVE

## 2011-06-09 LAB — VARICELLA ZOSTER ANTIBODY, IGG: Varicella: IMMUNE

## 2011-06-09 LAB — HIV ANTIBODY (ROUTINE TESTING W REFLEX): HIV: NONREACTIVE

## 2011-06-09 LAB — ANTIBODY SCREEN: Antibody Screen: NEGATIVE

## 2011-06-11 ENCOUNTER — Ambulatory Visit (HOSPITAL_COMMUNITY)
Admission: RE | Admit: 2011-06-11 | Discharge: 2011-06-11 | Disposition: A | Discharge status: Home / Self Care | Payer: Self-pay | Source: Ambulatory Visit | Attending: Family Medicine | Admitting: Family Medicine

## 2011-06-11 DIAGNOSIS — O358XX0 Maternal care for other (suspected) fetal abnormality and damage, not applicable or unspecified: Secondary | ICD-10-CM | POA: Insufficient documentation

## 2011-06-11 DIAGNOSIS — Z1389 Encounter for screening for other disorder: Secondary | ICD-10-CM | POA: Insufficient documentation

## 2011-06-11 DIAGNOSIS — O0930 Supervision of pregnancy with insufficient antenatal care, unspecified trimester: Secondary | ICD-10-CM | POA: Insufficient documentation

## 2011-06-11 DIAGNOSIS — O99330 Smoking (tobacco) complicating pregnancy, unspecified trimester: Secondary | ICD-10-CM | POA: Insufficient documentation

## 2011-06-11 DIAGNOSIS — Z3689 Encounter for other specified antenatal screening: Secondary | ICD-10-CM

## 2011-06-11 DIAGNOSIS — Z363 Encounter for antenatal screening for malformations: Secondary | ICD-10-CM | POA: Insufficient documentation

## 2011-07-01 LAB — POCT PREGNANCY, URINE
Preg Test, Ur: NEGATIVE
Preg Test, Ur: NEGATIVE

## 2011-07-01 LAB — URINE MICROSCOPIC-ADD ON: WBC, UA: NONE SEEN

## 2011-07-01 LAB — CBC
Hemoglobin: 14.1
MCHC: 34.3
MCV: 97
RDW: 12.8

## 2011-07-01 LAB — URINALYSIS, ROUTINE W REFLEX MICROSCOPIC
Bilirubin Urine: NEGATIVE
Glucose, UA: NEGATIVE
Glucose, UA: NEGATIVE
Ketones, ur: NEGATIVE
Nitrite: NEGATIVE
Specific Gravity, Urine: 1.02
Specific Gravity, Urine: 1.025
pH: 6.5
pH: 7

## 2011-07-01 LAB — GC/CHLAMYDIA PROBE AMP, GENITAL
Chlamydia, DNA Probe: POSITIVE — AB
GC Probe Amp, Genital: NEGATIVE

## 2011-07-10 DIAGNOSIS — F909 Attention-deficit hyperactivity disorder, unspecified type: Secondary | ICD-10-CM | POA: Insufficient documentation

## 2011-07-10 DIAGNOSIS — F319 Bipolar disorder, unspecified: Secondary | ICD-10-CM

## 2011-07-10 DIAGNOSIS — F411 Generalized anxiety disorder: Secondary | ICD-10-CM | POA: Insufficient documentation

## 2011-07-10 DIAGNOSIS — O0990 Supervision of high risk pregnancy, unspecified, unspecified trimester: Secondary | ICD-10-CM

## 2011-07-10 DIAGNOSIS — O0930 Supervision of pregnancy with insufficient antenatal care, unspecified trimester: Secondary | ICD-10-CM

## 2011-07-10 NOTE — Progress Notes (Signed)
Pt needs to see social worker and nutrition at her visit 07/13/2011. Per [facility] pt declined Flu vaccine.

## 2011-07-13 LAB — WET PREP, GENITAL
Trich, Wet Prep: NONE SEEN
WBC, Wet Prep HPF POC: NONE SEEN
Yeast Wet Prep HPF POC: NONE SEEN

## 2011-07-13 LAB — URINE MICROSCOPIC-ADD ON

## 2011-07-13 LAB — URINALYSIS, ROUTINE W REFLEX MICROSCOPIC
Glucose, UA: NEGATIVE
Glucose, UA: NEGATIVE
Leukocytes, UA: NEGATIVE
Nitrite: NEGATIVE
Protein, ur: NEGATIVE
Specific Gravity, Urine: 1.038 — ABNORMAL HIGH
pH: 5.5
pH: 6

## 2011-07-13 LAB — GC/CHLAMYDIA PROBE AMP, GENITAL: GC Probe Amp, Genital: NEGATIVE

## 2011-07-13 LAB — PREGNANCY, URINE: Preg Test, Ur: NEGATIVE

## 2011-07-14 LAB — URINALYSIS, ROUTINE W REFLEX MICROSCOPIC
Ketones, ur: NEGATIVE
Nitrite: NEGATIVE
Nitrite: NEGATIVE
Protein, ur: NEGATIVE
Protein, ur: NEGATIVE
Specific Gravity, Urine: 1.019
Urobilinogen, UA: 0.2
Urobilinogen, UA: 0.2

## 2011-07-14 LAB — WET PREP, GENITAL: Trich, Wet Prep: NONE SEEN

## 2011-07-14 LAB — GC/CHLAMYDIA PROBE AMP, GENITAL
Chlamydia, DNA Probe: NEGATIVE
GC Probe Amp, Genital: NEGATIVE

## 2011-07-20 ENCOUNTER — Other Ambulatory Visit: Payer: Self-pay | Admitting: Obstetrics and Gynecology

## 2011-07-20 ENCOUNTER — Ambulatory Visit (INDEPENDENT_AMBULATORY_CARE_PROVIDER_SITE_OTHER): Payer: Self-pay | Admitting: Obstetrics and Gynecology

## 2011-07-20 DIAGNOSIS — O99340 Other mental disorders complicating pregnancy, unspecified trimester: Secondary | ICD-10-CM

## 2011-07-20 DIAGNOSIS — F411 Generalized anxiety disorder: Secondary | ICD-10-CM

## 2011-07-20 DIAGNOSIS — F909 Attention-deficit hyperactivity disorder, unspecified type: Secondary | ICD-10-CM

## 2011-07-20 DIAGNOSIS — F319 Bipolar disorder, unspecified: Secondary | ICD-10-CM

## 2011-07-20 DIAGNOSIS — O0930 Supervision of pregnancy with insufficient antenatal care, unspecified trimester: Secondary | ICD-10-CM

## 2011-07-20 DIAGNOSIS — O0990 Supervision of high risk pregnancy, unspecified, unspecified trimester: Secondary | ICD-10-CM

## 2011-07-20 LAB — POCT URINALYSIS DIP (DEVICE)
Bilirubin Urine: NEGATIVE
Glucose, UA: NEGATIVE mg/dL
Ketones, ur: NEGATIVE mg/dL
Protein, ur: NEGATIVE mg/dL

## 2011-07-20 LAB — RAPID STREP SCREEN (MED CTR MEBANE ONLY): Streptococcus, Group A Screen (Direct): NEGATIVE

## 2011-07-20 MED ORDER — LAMOTRIGINE 25 MG PO TABS: 25.0000 mg | ORAL_TABLET | Freq: Every day | ORAL | Status: DC

## 2011-07-20 MED ORDER — CITALOPRAM HYDROBROMIDE 20 MG PO TABS: 20.0000 mg | ORAL_TABLET | Freq: Every day | ORAL | Status: DC

## 2011-07-20 MED ORDER — BUTALBITAL-APAP-CAFFEINE 50-325-40 MG PO TABS: 1.0000 | ORAL_TABLET | Freq: Four times a day (QID) | ORAL | Status: DC | PRN

## 2011-07-20 NOTE — Progress Notes (Signed)
[facility] is a [facility]ath [facility] secondary to multiple psychiatric diagnosis (bipolar dis, depression, anxiety, ADHD). Patient c/o daily headache. Patient also has stopped taking all psychiatric medication secondary to loss of medicaid. Patient was being followed by a mental health provided who will not see her while pregnant. Patient reports feeling depressed most of the time and felt better while on her medications. We will restart Celexa and Lamictal today. A rx for Fioricet was provided for her headaches. Patient denies any suicidal or homicidal ideation. Social worker to see patient today. FM/PTL precautions reviewed

## 2011-07-20 NOTE — Patient Instructions (Signed)
Depression, Adolescent and Adult Depression is a true and treatable medical condition. In general there are two kinds of depression:  Depression we all experience in some form. For example depression from the death of a loved one, financial distress or natural disasters will trigger or increase depression.   Clinical depression, on the other hand, appears without an apparent cause or reason. This depression is a disease. Depression may be caused by chemical imbalance in the body and brain or may come as a response to a physical illness. Alcohol and other drugs can cause depression.  DIAGNOSIS  The diagnosis of depression is usually based upon symptoms and medical history. TREATMENT Treatments for depression fall into three categories. These are:  Drug therapy. There are many medicines that treat depression. Responses may vary and sometimes trial and error is necessary to determine the best medicines and dosage for a particular patient.   Psychotherapy, also called talking treatments, helps people resolve their problems by looking at them from a different point of view and by giving people insight into their own personal makeup. Traditional psychotherapy looks at a childhood source of a problem. Other psychotherapy will look at current conflicts and move toward solving those. If the cause of depression is drug use, counseling is available to help abstain. In time the depression will usually improve. If there were underlying causes for the chemical use, they can be addressed.   ECT (electroconvulsive therapy) or shock treatment is not as commonly used today. It is a very effective treatment for severe suicidal depression. During ECT electrical impulses are applied to the head. These impulses cause a generalized seizure. It can be effective but causes a loss of memory for recent events. Sometimes this loss of memory may include the last several months.  Treat all depression or suicide threats as  serious. Obtain professional help. Do not wait to see if serious depression will get better over time without help. Seek help for yourself or those around you. In the [place] the number to the National Suicide Help Lines With 24 Hour Help Are: 1-800-SUICIDE 516-866-5383 Document Released: 09/18/2000 Document Re-Released: 12/18/2008 [facility] Patient Information 2011 [place], [place].Pregnancy - Third Trimester The third trimester of pregnancy (the last 3 months) is a period of the most rapid growth for you and your baby. The baby approaches a length of 20 inches and a weight of 6 to 10 pounds. The baby is adding on fat and getting ready for life outside your body. While inside, babies have periods of sleeping and waking, suck their thumbs, and hiccups. You can often feel small contractions of the uterus. This is false labor. It is also called Braxton-Hicks contractions. This is like a practice for labor. The usual problems in this stage of pregnancy include more difficulty breathing, swelling of the hands and feet from water retention, and having to urinate more often because of the uterus and baby pressing on your bladder.  PRENATAL EXAMS  Blood work may continue to be done during prenatal exams. These tests are done to check on your health and the probable health of your baby. Blood work is used to follow your blood levels (hemoglobin). Anemia (low hemoglobin) is common during pregnancy. Iron and vitamins are given to help prevent this. You may also continue to be checked for diabetes. Some of the past blood tests may be done again.   The size of the uterus is measured during each visit. This makes sure your baby is growing properly according to your  pregnancy dates.   Your blood pressure is checked every prenatal visit. This is to make sure you are not getting toxemia.   Your urine is checked every prenatal visit for infection, diabetes and protein.   Your weight is checked at each visit. This is  done to make sure gains are happening at the suggested rate and that you and your baby are growing normally.   Sometimes, an ultrasound is performed to confirm the position and the proper growth and development of the baby. This is a test done that bounces harmless sound waves off the baby so your caregiver can more accurately determine due dates.   Discuss the type of pain medication and anesthesia you will have during your labor and delivery.   Discuss the possibility and anesthesia if a Cesarean Section might be necessary.   Inform your caregiver if there is any mental or physical violence at home.  Sometimes, a specialized non-stress test, contraction stress test and biophysical profile are done to make sure the baby is not having a problem. Checking the amniotic fluid surrounding the baby is called an amniocentesis. The amniotic fluid is removed by sticking a needle into the belly (abdomen). This is sometimes done near the end of pregnancy if an early delivery is required. In this case, it is done to help make sure the baby's lungs are mature enough for the baby to live outside of the womb. If the lungs are not mature and it is unsafe to deliver the baby, an injection of cortisone medication is given to the mother 1 to 2 days before the delivery. This helps the baby's lungs mature and makes it safer to deliver the baby. CHANGES OCCURING IN THE THIRD TRIMESTER OF PREGNANCY Your body goes through many changes during pregnancy. They vary from person to person. Talk to your caregiver about changes you notice and are concerned about.  During the last trimester, you have probably had an increase in your appetite. It is normal to have cravings for certain foods. This varies from person to person and pregnancy to pregnancy.   You may begin to get stretch marks on your hips, abdomen, and breasts. These are normal changes in the body during pregnancy. There are no exercises or medications to take which  prevent this change.   Constipation may be treated with a stool softener or adding bulk to your diet. Drinking lots of fluids, fiber in vegetables, fruits, and whole grains are helpful.   Exercising is also helpful. If you have been very active up until your pregnancy, most of these activities can be continued during your pregnancy. If you have been less active, it is helpful to start an exercise program such as walking. Consult your caregiver before starting exercise programs.   Avoid all smoking, alcohol, un-prescribed drugs, herbs and "street drugs" during your pregnancy. These chemicals affect the formation and growth of the baby. Avoid chemicals throughout the pregnancy to ensure the delivery of a healthy infant.   Backache, varicose veins and hemorrhoids may develop or get worse.   You will tire more easily in the third trimester, which is normal.   The baby's movements may be stronger and more often.   You may become short of breath easily.   Your belly button may stick out.   A yellow discharge may leak from your breasts called colostrum.   You may have a bloody mucus discharge. This usually occurs a few days to a week before labor begins.  HOME  CARE INSTRUCTIONS  Keep your caregiver's appointments. Follow your caregiver's instructions regarding medication use, exercise, and diet.   During pregnancy, you are providing food for you and your baby. Continue to eat regular, well-balanced meals. Choose foods such as meat, fish, milk and other low fat dairy products, vegetables, fruits, and whole-grain breads and cereals. Your caregiver will tell you of the ideal weight gain.   A physical sexual relationship may be continued throughout pregnancy if there are no other problems such as early (premature) leaking of amniotic fluid from the membranes, vaginal bleeding, or belly (abdominal) pain.   Exercise regularly if there are no restrictions. Check with your caregiver if you are unsure  of the safety of your exercises. Greater weight gain will occur in the last 2 trimesters of pregnancy. Exercising helps:   Control your weight.   Get you in shape for labor and delivery.   You lose weight after you deliver.   Rest a lot with legs elevated, or as needed for leg cramps or low back pain.   Wear a good support or jogging bra for breast tenderness during pregnancy. This may help if worn during sleep. Pads or tissues may be used in the bra if you are leaking colostrum.   Do not use hot tubs, steam rooms, or saunas.   Wear your seat belt when driving. This protects you and your baby if you are in an accident.   Avoid raw meat, cat litter boxes and soil used by cats. These carry germs that can cause birth defects in the baby.   It is easier to loose urine during pregnancy. Tightening up and strengthening the pelvic muscles will help with this problem. You can practice stopping your urination while you are going to the bathroom. These are the same muscles you need to strengthen. It is also the muscles you would use if you were trying to stop from passing gas. You can practice tightening these muscles up 10 times a set and repeating this about 3 times per day. Once you know what muscles to tighten up, do not perform these exercises during urination. It is more likely to cause an infection by backing up the urine.   Ask for help if you have financial, counseling or nutritional needs during pregnancy. Your caregiver will be able to offer counseling for these needs as well as refer you for other special needs.   Make a list of emergency phone numbers and have them available.   Plan on getting help from family or friends when you go home from the hospital.   Make a trial run to the hospital.   Take prenatal classes with the father to understand, practice and ask questions about the labor and delivery.   Prepare the baby's room/nursery.   Do not travel out of the city unless it is  absolutely necessary and with the advice of your caregiver.   Wear only low or no heal shoes to have better balance and prevent falling.  MEDICATIONS AND DRUG USE IN PREGNANCY  Take prenatal vitamins as directed. The vitamin should contain 1 milligram of folic acid. Keep all vitamins out of reach of children. Only a couple vitamins or tablets containing iron may be fatal to a baby or young child when ingested.   Avoid use of all medications, including herbs, over-the-counter medications, not prescribed or suggested by your caregiver. Only take over-the-counter or prescription medicines for pain, discomfort, or fever as directed by your caregiver. Do not use  aspirin, ibuprofen (Motrin, Advil, Nuprin) or naproxen (Aleve) unless OK'd by your caregiver.   Let your caregiver also know about herbs you may be using.   Alcohol is related to a number of birth defects. This includes fetal alcohol syndrome. All alcohol, in any form, should be avoided completely. Smoking will cause low birth rate and premature babies.   Street/illegal drugs are very harmful to the baby. They are absolutely forbidden. A baby born to an addicted mother will be addicted at birth. The baby will go through the same withdrawal an adult does.  SEEK MEDICAL CARE IF  You have any concerns or worries during your pregnancy. It is better to call with your questions if you feel they cannot wait, rather than worry about them.  DECISIONS ABOUT CIRCUMCISION You may or may not know the sex of your baby. If you know your baby is a boy, it may be time to think about circumcision. Circumcision is the removal of the foreskin of the penis. This is the skin that covers the sensitive end of the penis. There is no proven medical need for this. Often this decision is made on what is popular at the time or based upon religious beliefs and social issues. You can discuss these issues with your caregiver or pediatrician. SEEK IMMEDIATE MEDICAL CARE  IF:  An unexplained oral temperature above 100.4 develops, or as your caregiver suggests.   You have leaking of fluid from the vagina (birth canal). If leaking membranes are suspected, take your temperature and tell your caregiver of this when you call.   There is vaginal spotting, bleeding or passing clots. Tell your caregiver of the amount and how many pads are used.   You develop a bad smelling vaginal discharge with a change in the color from clear to white.   You develop vomiting that lasts more than 24 hours.   You develop chills or fever.   You develop shortness of breath.   You develop burning on urination.   You loose more than 2 pounds of weight or gain more than 2 pounds of weight or as suggested by your caregiver.   You notice sudden swelling of your face, hands, and feet or legs.   You develop belly (abdominal) pain. Round ligament discomfort is a common non-cancerous (benign) cause of abdominal pain in pregnancy. Your caregiver still must evaluate you.   You develop a severe headache that does not go away.   You develop visual problems, blurred or double vision.   If you have not felt your baby move for more than 1 hour. If you think the baby is not moving as much as usual, eat something with sugar in it and lie down on your left side for an hour. The baby should move at least 4 to 5 times per hour. Call right away if your baby moves less than that.   You fall, are in a car accident or any kind of trauma.   There is mental or physical violence at home.  Document Released: 09/15/2001 Document Re-Released: 07/19/2009 [facility] Patient Information 2011 [place], [place].

## 2011-07-20 NOTE — Progress Notes (Signed)
P=84, c/o trace edema in ankles at times,c/o some pain in abdomen this am, but it resolved,pt states used to be on 3 different medications, but stopped in early pregnancy because didn't have medicaid- states was on Celexa, also a mood stabilitzer and a med for anxiety- states stopped all 3 in March.  Depression screen: Depressed or sad mood, most of the day, nearly daily  - pt. States " 70% of the time" Diminished interest/pleasure in activities,most of the day? Pt states"70% of the time"

## 2011-07-27 ENCOUNTER — Encounter: Payer: Self-pay | Admitting: Obstetrics & Gynecology

## 2011-07-27 ENCOUNTER — Ambulatory Visit (INDEPENDENT_AMBULATORY_CARE_PROVIDER_SITE_OTHER): Payer: Medicaid Other | Admitting: Obstetrics and Gynecology

## 2011-07-27 VITALS — BP 104/70 | HR 93 | Temp 96.7°F | Wt 144.2 lb

## 2011-07-27 DIAGNOSIS — F341 Dysthymic disorder: Secondary | ICD-10-CM

## 2011-07-27 DIAGNOSIS — O0990 Supervision of high risk pregnancy, unspecified, unspecified trimester: Secondary | ICD-10-CM

## 2011-07-27 DIAGNOSIS — O0930 Supervision of pregnancy with insufficient antenatal care, unspecified trimester: Secondary | ICD-10-CM

## 2011-07-27 DIAGNOSIS — F319 Bipolar disorder, unspecified: Secondary | ICD-10-CM

## 2011-07-27 DIAGNOSIS — F1310 Sedative, hypnotic or anxiolytic abuse, uncomplicated: Secondary | ICD-10-CM

## 2011-07-27 DIAGNOSIS — F1910 Other psychoactive substance abuse, uncomplicated: Secondary | ICD-10-CM

## 2011-07-27 DIAGNOSIS — F329 Major depressive disorder, single episode, unspecified: Secondary | ICD-10-CM

## 2011-07-27 LAB — POCT URINALYSIS DIP (DEVICE)
Glucose, UA: NEGATIVE mg/dL
Ketones, ur: NEGATIVE mg/dL
Protein, ur: 30 mg/dL — AB
Specific Gravity, Urine: >=1.03 (ref 1.005–1.030)

## 2011-07-27 MED ORDER — BUTALBITAL-APAP-CAFF-COD 50-325-40-30 MG PO CAPS: 1.0000 | ORAL_CAPSULE | ORAL | Status: DC | PRN

## 2011-07-27 NOTE — Progress Notes (Signed)
S:Started Lamictal and Celexa last week and noting slight improvement. 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 Unable to stay for SS today d/t/ appointment at [facility] (on probation).  No UTI sx. H/As better with Fiorocet and requests refill. O: Affect appropriate. Seems slightly depressed. UA dip +       Pro and LE A: Bipolar and anx/Dep P: Check UDS today. Check urine C&S. Increase fluids. Refill Fiorocet.

## 2011-07-27 NOTE — Progress Notes (Signed)
Addended by: Faythe Casa on: 07/27/2011 11:01 AM   Modules accepted: Orders

## 2011-07-27 NOTE — Patient Instructions (Signed)
Take meds as directed. Use Fiorocet if Tylenol ineffective for the headaches.Pregnancy - Third Trimester The third trimester of pregnancy (the last 3 months) is a period of the most rapid growth for you and your baby. The baby approaches a length of 20 inches and a weight of 6 to 10 pounds. The baby is adding on fat and getting ready for life outside your body. While inside, babies have periods of sleeping and waking, suck their thumbs, and hiccups. You can often feel small contractions of the uterus. This is false labor. It is also called Braxton-Hicks contractions. This is like a practice for labor. The usual problems in this stage of pregnancy include more difficulty breathing, swelling of the hands and feet from water retention, and having to urinate more often because of the uterus and baby pressing on your bladder.  PRENATAL EXAMS  Blood work may continue to be done during prenatal exams. These tests are done to check on your health and the probable health of your baby. Blood work is used to follow your blood levels (hemoglobin). Anemia (low hemoglobin) is common during pregnancy. Iron and vitamins are given to help prevent this. You may also continue to be checked for diabetes. Some of the past blood tests may be done again.   The size of the uterus is measured during each visit. This makes sure your baby is growing properly according to your pregnancy dates.   Your blood pressure is checked every prenatal visit. This is to make sure you are not getting toxemia.   Your urine is checked every prenatal visit for infection, diabetes and protein.   Your weight is checked at each visit. This is done to make sure gains are happening at the suggested rate and that you and your baby are growing normally.   Sometimes, an ultrasound is performed to confirm the position and the proper growth and development of the baby. This is a test done that bounces harmless sound waves off the baby so your caregiver  can more accurately determine due dates.   Discuss the type of pain medication and anesthesia you will have during your labor and delivery.   Discuss the possibility and anesthesia if a Cesarean Section might be necessary.   Inform your caregiver if there is any mental or physical violence at home.  Sometimes, a specialized non-stress test, contraction stress test and biophysical profile are done to make sure the baby is not having a problem. Checking the amniotic fluid surrounding the baby is called an amniocentesis. The amniotic fluid is removed by sticking a needle into the belly (abdomen). This is sometimes done near the end of pregnancy if an early delivery is required. In this case, it is done to help make sure the baby's lungs are mature enough for the baby to live outside of the womb. If the lungs are not mature and it is unsafe to deliver the baby, an injection of cortisone medication is given to the mother 1 to 2 days before the delivery. This helps the baby's lungs mature and makes it safer to deliver the baby. CHANGES OCCURING IN THE THIRD TRIMESTER OF PREGNANCY Your body goes through many changes during pregnancy. They vary from person to person. Talk to your caregiver about changes you notice and are concerned about.  During the last trimester, you have probably had an increase in your appetite. It is normal to have cravings for certain foods. This varies from person to person and pregnancy to pregnancy.  You may begin to get stretch marks on your hips, abdomen, and breasts. These are normal changes in the body during pregnancy. There are no exercises or medications to take which prevent this change.   Constipation may be treated with a stool softener or adding bulk to your diet. Drinking lots of fluids, fiber in vegetables, fruits, and whole grains are helpful.   Exercising is also helpful. If you have been very active up until your pregnancy, most of these activities can be  continued during your pregnancy. If you have been less active, it is helpful to start an exercise program such as walking. Consult your caregiver before starting exercise programs.   Avoid all smoking, alcohol, un-prescribed drugs, herbs and "street drugs" during your pregnancy. These chemicals affect the formation and growth of the baby. Avoid chemicals throughout the pregnancy to ensure the delivery of a healthy infant.   Backache, varicose veins and hemorrhoids may develop or get worse.   You will tire more easily in the third trimester, which is normal.   The baby's movements may be stronger and more often.   You may become short of breath easily.   Your belly button may stick out.   A yellow discharge may leak from your breasts called colostrum.   You may have a bloody mucus discharge. This usually occurs a few days to a week before labor begins.  HOME CARE INSTRUCTIONS   Keep your caregiver's appointments. Follow your caregiver's instructions regarding medication use, exercise, and diet.   During pregnancy, you are providing food for you and your baby. Continue to eat regular, well-balanced meals. Choose foods such as meat, fish, milk and other low fat dairy products, vegetables, fruits, and whole-grain breads and cereals. Your caregiver will tell you of the ideal weight gain.   A physical sexual relationship may be continued throughout pregnancy if there are no other problems such as early (premature) leaking of amniotic fluid from the membranes, vaginal bleeding, or belly (abdominal) pain.   Exercise regularly if there are no restrictions. Check with your caregiver if you are unsure of the safety of your exercises. Greater weight gain will occur in the last 2 trimesters of pregnancy. Exercising helps:   Control your weight.   Get you in shape for labor and delivery.   You lose weight after you deliver.   Rest a lot with legs elevated, or as needed for leg cramps or low back  pain.   Wear a good support or jogging bra for breast tenderness during pregnancy. This may help if worn during sleep. Pads or tissues may be used in the bra if you are leaking colostrum.   Do not use hot tubs, steam rooms, or saunas.   Wear your seat belt when driving. This protects you and your baby if you are in an accident.   Avoid raw meat, cat litter boxes and soil used by cats. These carry germs that can cause birth defects in the baby.   It is easier to loose urine during pregnancy. Tightening up and strengthening the pelvic muscles will help with this problem. You can practice stopping your urination while you are going to the bathroom. These are the same muscles you need to strengthen. It is also the muscles you would use if you were trying to stop from passing gas. You can practice tightening these muscles up 10 times a set and repeating this about 3 times per day. Once you know what muscles to tighten up, do  not perform these exercises during urination. It is more likely to cause an infection by backing up the urine.   Ask for help if you have financial, counseling or nutritional needs during pregnancy. Your caregiver will be able to offer counseling for these needs as well as refer you for other special needs.   Make a list of emergency phone numbers and have them available.   Plan on getting help from family or friends when you go home from the hospital.   Make a trial run to the hospital.   Take prenatal classes with the father to understand, practice and ask questions about the labor and delivery.   Prepare the baby's room/nursery.   Do not travel out of the city unless it is absolutely necessary and with the advice of your caregiver.   Wear only low or no heal shoes to have better balance and prevent falling.  MEDICATIONS AND DRUG USE IN PREGNANCY  Take prenatal vitamins as directed. The vitamin should contain 1 milligram of folic acid. Keep all vitamins out of reach of  children. Only a couple vitamins or tablets containing iron may be fatal to a baby or young child when ingested.   Avoid use of all medications, including herbs, over-the-counter medications, not prescribed or suggested by your caregiver. Only take over-the-counter or prescription medicines for pain, discomfort, or fever as directed by your caregiver. Do not use aspirin, ibuprofen (Motrin, Advil, Nuprin) or naproxen (Aleve) unless OK'd by your caregiver.   Let your caregiver also know about herbs you may be using.   Alcohol is related to a number of birth defects. This includes fetal alcohol syndrome. All alcohol, in any form, should be avoided completely. Smoking will cause low birth rate and premature babies.   Street/illegal drugs are very harmful to the baby. They are absolutely forbidden. A baby born to an addicted mother will be addicted at birth. The baby will go through the same withdrawal an adult does.  SEEK MEDICAL CARE IF: You have any concerns or worries during your pregnancy. It is better to call with your questions if you feel they cannot wait, rather than worry about them. DECISIONS ABOUT CIRCUMCISION You may or may not know the sex of your baby. If you know your baby is a boy, it may be time to think about circumcision. Circumcision is the removal of the foreskin of the penis. This is the skin that covers the sensitive end of the penis. There is no proven medical need for this. Often this decision is made on what is popular at the time or based upon religious beliefs and social issues. You can discuss these issues with your caregiver or pediatrician. SEEK IMMEDIATE MEDICAL CARE IF:   An unexplained oral temperature above 102 F (38.9 C) develops, or as your caregiver suggests.   You have leaking of fluid from the vagina (birth canal). If leaking membranes are suspected, take your temperature and tell your caregiver of this when you call.   There is vaginal spotting, bleeding  or passing clots. Tell your caregiver of the amount and how many pads are used.   You develop a bad smelling vaginal discharge with a change in the color from clear to white.   You develop vomiting that lasts more than 24 hours.   You develop chills or fever.   You develop shortness of breath.   You develop burning on urination.   You loose more than 2 pounds of weight or gain more  than 2 pounds of weight or as suggested by your caregiver.   You notice sudden swelling of your face, hands, and feet or legs.   You develop belly (abdominal) pain. Round ligament discomfort is a common non-cancerous (benign) cause of abdominal pain in pregnancy. Your caregiver still must evaluate you.   You develop a severe headache that does not go away.   You develop visual problems, blurred or double vision.   If you have not felt your baby move for more than 1 hour. If you think the baby is not moving as much as usual, eat something with sugar in it and lie down on your left side for an hour. The baby should move at least 4 to 5 times per hour. Call right away if your baby moves less than that.   You fall, are in a car accident or any kind of trauma.   There is mental or physical violence at home.  Document Released: 09/15/2001 Document Revised: 06/03/2011 Document Reviewed: 03/20/2009 [facility] Patient Information 2012 [place], [place].

## 2011-07-28 ENCOUNTER — Encounter: Payer: Self-pay | Admitting: Obstetrics & Gynecology

## 2011-07-28 ENCOUNTER — Telehealth: Payer: Self-pay | Admitting: *Deleted

## 2011-07-28 NOTE — Telephone Encounter (Signed)
Pt left message stating that her medication Rx is not @ the pharmacy. The Rx had printed instead of e-prescribing and we did not realize it until the end of business yesterday- original paper Rx was destroyed yesterday by this RN.  I phoned in the Rx today per Deirdre's order yesterday. Pt notified by phone and voiced understanding.

## 2011-07-28 NOTE — Progress Notes (Addendum)
Rx for fioricet did not e-prescribe. I phoned in the Rx on 07/28/11 to [facility] [phone]. Pt notified.

## 2011-07-29 ENCOUNTER — Telehealth: Payer: Self-pay | Admitting: *Deleted

## 2011-07-29 DIAGNOSIS — F1310 Sedative, hypnotic or anxiolytic abuse, uncomplicated: Secondary | ICD-10-CM | POA: Insufficient documentation

## 2011-07-29 LAB — CULTURE, OB URINE: Colony Count: 4000

## 2011-07-29 MED ORDER — GABAPENTIN 300 MG PO CAPS: 300.0000 mg | ORAL_CAPSULE | Freq: Three times a day (TID) | ORAL | Status: DC | PRN

## 2011-07-29 NOTE — Progress Notes (Signed)
Addended by: Jill Side on: 07/29/2011 03:19 PM   Modules accepted: Orders

## 2011-07-29 NOTE — Telephone Encounter (Signed)
Patient called and stated that she was prescribed Fioricet at last office visit for her headaches. She is currently on probation and her probation officer said that she really shouldn't be on any narcotics. She wanted to know if we can prescribe Gabapentin 300mg  three times a day. She has used this in the past for her headaches and wanted to know if this is possible.

## 2011-07-29 NOTE — Progress Notes (Signed)
Addended by: Jill Side on: 07/29/2011 03:14 PM   Modules accepted: Orders

## 2011-08-10 ENCOUNTER — Encounter: Payer: Self-pay | Admitting: Advanced Practice Midwife

## 2011-08-10 ENCOUNTER — Encounter: Payer: Self-pay | Admitting: *Deleted

## 2011-08-10 ENCOUNTER — Ambulatory Visit (INDEPENDENT_AMBULATORY_CARE_PROVIDER_SITE_OTHER): Payer: Medicaid Other | Admitting: Advanced Practice Midwife

## 2011-08-10 VITALS — BP 114/69 | Temp 97.0°F | Wt 147.9 lb

## 2011-08-10 DIAGNOSIS — O99340 Other mental disorders complicating pregnancy, unspecified trimester: Secondary | ICD-10-CM

## 2011-08-10 DIAGNOSIS — F1920 Other psychoactive substance dependence, uncomplicated: Secondary | ICD-10-CM

## 2011-08-10 DIAGNOSIS — O0930 Supervision of pregnancy with insufficient antenatal care, unspecified trimester: Secondary | ICD-10-CM

## 2011-08-10 DIAGNOSIS — O0990 Supervision of high risk pregnancy, unspecified, unspecified trimester: Secondary | ICD-10-CM

## 2011-08-10 LAB — POCT URINALYSIS DIP (DEVICE)
Bilirubin Urine: NEGATIVE
Glucose, UA: >=1000 mg/dL — AB
Ketones, ur: NEGATIVE mg/dL
Leukocytes, UA: NEGATIVE
Nitrite: NEGATIVE

## 2011-08-10 MED ORDER — GABAPENTIN 300 MG PO CAPS: 600.0000 mg | ORAL_CAPSULE | Freq: Three times a day (TID) | ORAL | Status: DC | PRN

## 2011-08-10 MED ORDER — BUTALBITAL-APAP-CAFFEINE 50-325-40 MG PO TABS: 1.0000 | ORAL_TABLET | Freq: Four times a day (QID) | ORAL | Status: DC | PRN

## 2011-08-10 MED ORDER — COMFORT FIT MATERNITY SUPP SM MISC: 1.0000 [IU] | Status: DC | PRN

## 2011-08-10 NOTE — Progress Notes (Signed)
P=92 , states it hurts" real bad to walk sometimes- feels like something is pressing down there", states taking 2 neurontin TID instead of 1 to relieve the headaches, c/o trace edema in feet/ankles/fingers- sometimes worse,

## 2011-08-10 NOTE — Progress Notes (Signed)
C/O pelvic pain with walking, rx maternity support brace. Still having headaches - taking Neurontin 600 mg TID with some relief, would like to try Fiorcet again. Encouraged hydration. Rev'd precautions, GBS and GC/CT at next visit.

## 2011-08-10 NOTE — Patient Instructions (Signed)
Pregnancy - Third Trimester The third trimester of pregnancy (the last 3 months) is a period of the most rapid growth for you and your baby. The baby approaches a length of 20 inches and a weight of 6 to 10 pounds. The baby is adding on fat and getting ready for life outside your body. While inside, babies have periods of sleeping and waking, suck their thumbs, and hiccups. You can often feel small contractions of the uterus. This is false labor. It is also called Braxton-Hicks contractions. This is like a practice for labor. The usual problems in this stage of pregnancy include more difficulty breathing, swelling of the hands and feet from water retention, and having to urinate more often because of the uterus and baby pressing on your bladder.  PRENATAL EXAMS  Blood work may continue to be done during prenatal exams. These tests are done to check on your health and the probable health of your baby. Blood work is used to follow your blood levels (hemoglobin). Anemia (low hemoglobin) is common during pregnancy. Iron and vitamins are given to help prevent this. You may also continue to be checked for diabetes. Some of the past blood tests may be done again.   The size of the uterus is measured during each visit. This makes sure your baby is growing properly according to your pregnancy dates.   Your blood pressure is checked every prenatal visit. This is to make sure you are not getting toxemia.   Your urine is checked every prenatal visit for infection, diabetes and protein.   Your weight is checked at each visit. This is done to make sure gains are happening at the suggested rate and that you and your baby are growing normally.   Sometimes, an ultrasound is performed to confirm the position and the proper growth and development of the baby. This is a test done that bounces harmless sound waves off the baby so your caregiver can more accurately determine due dates.   Discuss the type of pain  medication and anesthesia you will have during your labor and delivery.   Discuss the possibility and anesthesia if a Cesarean Section might be necessary.   Inform your caregiver if there is any mental or physical violence at home.  Sometimes, a specialized non-stress test, contraction stress test and biophysical profile are done to make sure the baby is not having a problem. Checking the amniotic fluid surrounding the baby is called an amniocentesis. The amniotic fluid is removed by sticking a needle into the belly (abdomen). This is sometimes done near the end of pregnancy if an early delivery is required. In this case, it is done to help make sure the baby's lungs are mature enough for the baby to live outside of the womb. If the lungs are not mature and it is unsafe to deliver the baby, an injection of cortisone medication is given to the mother 1 to 2 days before the delivery. This helps the baby's lungs mature and makes it safer to deliver the baby. CHANGES OCCURING IN THE THIRD TRIMESTER OF PREGNANCY Your body goes through many changes during pregnancy. They vary from person to person. Talk to your caregiver about changes you notice and are concerned about.  During the last trimester, you have probably had an increase in your appetite. It is normal to have cravings for certain foods. This varies from person to person and pregnancy to pregnancy.   You may begin to get stretch marks on your hips,   abdomen, and breasts. These are normal changes in the body during pregnancy. There are no exercises or medications to take which prevent this change.   Constipation may be treated with a stool softener or adding bulk to your diet. Drinking lots of fluids, fiber in vegetables, fruits, and whole grains are helpful.   Exercising is also helpful. If you have been very active up until your pregnancy, most of these activities can be continued during your pregnancy. If you have been less active, it is helpful  to start an exercise program such as walking. Consult your caregiver before starting exercise programs.   Avoid all smoking, alcohol, un-prescribed drugs, herbs and "street drugs" during your pregnancy. These chemicals affect the formation and growth of the baby. Avoid chemicals throughout the pregnancy to ensure the delivery of a healthy infant.   Backache, varicose veins and hemorrhoids may develop or get worse.   You will tire more easily in the third trimester, which is normal.   The baby's movements may be stronger and more often.   You may become short of breath easily.   Your belly button may stick out.   A yellow discharge may leak from your breasts called colostrum.   You may have a bloody mucus discharge. This usually occurs a few days to a week before labor begins.  HOME CARE INSTRUCTIONS   Keep your caregiver's appointments. Follow your caregiver's instructions regarding medication use, exercise, and diet.   During pregnancy, you are providing food for you and your baby. Continue to eat regular, well-balanced meals. Choose foods such as meat, fish, milk and other low fat dairy products, vegetables, fruits, and whole-grain breads and cereals. Your caregiver will tell you of the ideal weight gain.   A physical sexual relationship may be continued throughout pregnancy if there are no other problems such as early (premature) leaking of amniotic fluid from the membranes, vaginal bleeding, or belly (abdominal) pain.   Exercise regularly if there are no restrictions. Check with your caregiver if you are unsure of the safety of your exercises. Greater weight gain will occur in the last 2 trimesters of pregnancy. Exercising helps:   Control your weight.   Get you in shape for labor and delivery.   You lose weight after you deliver.   Rest a lot with legs elevated, or as needed for leg cramps or low back pain.   Wear a good support or jogging bra for breast tenderness during  pregnancy. This may help if worn during sleep. Pads or tissues may be used in the bra if you are leaking colostrum.   Do not use hot tubs, steam rooms, or saunas.   Wear your seat belt when driving. This protects you and your baby if you are in an accident.   Avoid raw meat, cat litter boxes and soil used by cats. These carry germs that can cause birth defects in the baby.   It is easier to loose urine during pregnancy. Tightening up and strengthening the pelvic muscles will help with this problem. You can practice stopping your urination while you are going to the bathroom. These are the same muscles you need to strengthen. It is also the muscles you would use if you were trying to stop from passing gas. You can practice tightening these muscles up 10 times a set and repeating this about 3 times per day. Once you know what muscles to tighten up, do not perform these exercises during urination. It is more likely   to cause an infection by backing up the urine.   Ask for help if you have financial, counseling or nutritional needs during pregnancy. Your caregiver will be able to offer counseling for these needs as well as refer you for other special needs.   Make a list of emergency phone numbers and have them available.   Plan on getting help from family or friends when you go home from the hospital.   Make a trial run to the hospital.   Take prenatal classes with the father to understand, practice and ask questions about the labor and delivery.   Prepare the baby's room/nursery.   Do not travel out of the city unless it is absolutely necessary and with the advice of your caregiver.   Wear only low or no heal shoes to have better balance and prevent falling.  MEDICATIONS AND DRUG USE IN PREGNANCY  Take prenatal vitamins as directed. The vitamin should contain 1 milligram of folic acid. Keep all vitamins out of reach of children. Only a couple vitamins or tablets containing iron may be fatal  to a baby or young child when ingested.   Avoid use of all medications, including herbs, over-the-counter medications, not prescribed or suggested by your caregiver. Only take over-the-counter or prescription medicines for pain, discomfort, or fever as directed by your caregiver. Do not use aspirin, ibuprofen (Motrin, Advil, Nuprin) or naproxen (Aleve) unless OK'd by your caregiver.   Let your caregiver also know about herbs you may be using.   Alcohol is related to a number of birth defects. This includes fetal alcohol syndrome. All alcohol, in any form, should be avoided completely. Smoking will cause low birth rate and premature babies.   Street/illegal drugs are very harmful to the baby. They are absolutely forbidden. A baby born to an addicted mother will be addicted at birth. The baby will go through the same withdrawal an adult does.  SEEK MEDICAL CARE IF: You have any concerns or worries during your pregnancy. It is better to call with your questions if you feel they cannot wait, rather than worry about them. DECISIONS ABOUT CIRCUMCISION You may or may not know the sex of your baby. If you know your baby is a boy, it may be time to think about circumcision. Circumcision is the removal of the foreskin of the penis. This is the skin that covers the sensitive end of the penis. There is no proven medical need for this. Often this decision is made on what is popular at the time or based upon religious beliefs and social issues. You can discuss these issues with your caregiver or pediatrician. SEEK IMMEDIATE MEDICAL CARE IF:   An unexplained oral temperature above 102 F (38.9 C) develops, or as your caregiver suggests.   You have leaking of fluid from the vagina (birth canal). If leaking membranes are suspected, take your temperature and tell your caregiver of this when you call.   There is vaginal spotting, bleeding or passing clots. Tell your caregiver of the amount and how many pads are  used.   You develop a bad smelling vaginal discharge with a change in the color from clear to white.   You develop vomiting that lasts more than 24 hours.   You develop chills or fever.   You develop shortness of breath.   You develop burning on urination.   You loose more than 2 pounds of weight or gain more than 2 pounds of weight or as suggested by your   caregiver.   You notice sudden swelling of your face, hands, and feet or legs.   You develop belly (abdominal) pain. Round ligament discomfort is a common non-cancerous (benign) cause of abdominal pain in pregnancy. Your caregiver still must evaluate you.   You develop a severe headache that does not go away.   You develop visual problems, blurred or double vision.   If you have not felt your baby move for more than 1 hour. If you think the baby is not moving as much as usual, eat something with sugar in it and lie down on your left side for an hour. The baby should move at least 4 to 5 times per hour. Call right away if your baby moves less than that.   You fall, are in a car accident or any kind of trauma.   There is mental or physical violence at home.  Document Released: 09/15/2001 Document Revised: 06/03/2011 Document Reviewed: 03/20/2009 [employer] Patient Information 2012 [employer]. 

## 2011-08-11 ENCOUNTER — Telehealth: Payer: Self-pay | Admitting: *Deleted

## 2011-08-11 NOTE — Telephone Encounter (Signed)
Pt left message stating that because of the program that she is in, she is not supposed to have the Fioricet. She asked if there is anything else that is non-narcotic which could be prescribed for her headaches. I called pt back and discussed the situation with her. I states that other than tylenol and meds that are unsafe in pregnancy, there are no other medications that would be non-narcotic. I reminded that we had prescribed the neurontin for her several weeks ago because of this same situation and that was refilled for her yesterday. Pt said that does not always help with the headaches. I told pt she will need to discuss with the doctor @ her next visit 11/15. Pt agreed and said she will contact Michael Boston with the [facility] program to inform her. I stated that I will also talk with [facility]. Pt voiced understanding.

## 2011-08-11 NOTE — Telephone Encounter (Signed)
Pt left message stating that she would like all her new prescriptions from 11/5 re-called in to [facility] on [address] instead of [facility].  I spoke w/pt and told her that she can have her prescriptions transferred by calling the [facility] and making the request. Pt voiced understanding. She also stated that when she had called yesterday to check on the prescriptions, the [facility] did not have the Fioricet Rx in the file. I stated that I had checked the order and it was sent. I will call the pharmacist @ [facility] and clarify. Pt had no further questions. After speaking w/pt I called [facility] [phone] and in fact they did not have the Rx for Fioricet on file. I left the info on their Rx recorded line per the original order from 08/10/11.

## 2011-08-12 NOTE — Progress Notes (Signed)
Addended by: Jill Side on: 08/12/2011 09:16 AM   Modules accepted: Orders

## 2011-08-20 ENCOUNTER — Ambulatory Visit (INDEPENDENT_AMBULATORY_CARE_PROVIDER_SITE_OTHER): Payer: Medicaid Other | Admitting: Obstetrics and Gynecology

## 2011-08-20 DIAGNOSIS — R51 Headache: Secondary | ICD-10-CM

## 2011-08-20 DIAGNOSIS — O99320 Drug use complicating pregnancy, unspecified trimester: Secondary | ICD-10-CM

## 2011-08-20 DIAGNOSIS — G8929 Other chronic pain: Secondary | ICD-10-CM | POA: Insufficient documentation

## 2011-08-20 DIAGNOSIS — O99340 Other mental disorders complicating pregnancy, unspecified trimester: Secondary | ICD-10-CM

## 2011-08-20 DIAGNOSIS — O0990 Supervision of high risk pregnancy, unspecified, unspecified trimester: Secondary | ICD-10-CM

## 2011-08-20 DIAGNOSIS — O0930 Supervision of pregnancy with insufficient antenatal care, unspecified trimester: Secondary | ICD-10-CM

## 2011-08-20 LAB — CBC
MCH: 31.1 pg (ref 26.0–34.0)
MCV: 95.6 fL (ref 78.0–100.0)
Platelets: 405 10*3/uL — ABNORMAL HIGH (ref 150–400)
RBC: 3.63 MIL/uL — ABNORMAL LOW (ref 3.87–5.11)
RDW: 12.8 % (ref 11.5–15.5)
WBC: 12.4 10*3/uL — ABNORMAL HIGH (ref 4.0–10.5)

## 2011-08-20 LAB — POCT URINALYSIS DIP (DEVICE)
Glucose, UA: NEGATIVE mg/dL
Nitrite: NEGATIVE
Protein, ur: NEGATIVE mg/dL
Specific Gravity, Urine: 1.01 (ref 1.005–1.030)
Urobilinogen, UA: 0.2 mg/dL (ref 0.0–1.0)

## 2011-08-20 MED ORDER — GABAPENTIN 300 MG PO CAPS: 600.0000 mg | ORAL_CAPSULE | Freq: Three times a day (TID) | ORAL | Status: DC | PRN

## 2011-08-20 MED ORDER — BUTALBITAL-APAP-CAFFEINE 50-325-40 MG PO TABS: 1.0000 | ORAL_TABLET | Freq: Four times a day (QID) | ORAL | Status: DC | PRN

## 2011-08-20 NOTE — Patient Instructions (Signed)
Pregnancy - Third Trimester The third trimester of pregnancy (the last 3 months) is a period of the most rapid growth for you and your baby. The baby approaches a length of 20 inches and a weight of 6 to 10 pounds. The baby is adding on fat and getting ready for life outside your body. While inside, babies have periods of sleeping and waking, suck their thumbs, and hiccups. You can often feel small contractions of the uterus. This is false labor. It is also called Braxton-Hicks contractions. This is like a practice for labor. The usual problems in this stage of pregnancy include more difficulty breathing, swelling of the hands and feet from water retention, and having to urinate more often because of the uterus and baby pressing on your bladder.  PRENATAL EXAMS  Blood work may continue to be done during prenatal exams. These tests are done to check on your health and the probable health of your baby. Blood work is used to follow your blood levels (hemoglobin). Anemia (low hemoglobin) is common during pregnancy. Iron and vitamins are given to help prevent this. You may also continue to be checked for diabetes. Some of the past blood tests may be done again.   The size of the uterus is measured during each visit. This makes sure your baby is growing properly according to your pregnancy dates.   Your blood pressure is checked every prenatal visit. This is to make sure you are not getting toxemia.   Your urine is checked every prenatal visit for infection, diabetes and protein.   Your weight is checked at each visit. This is done to make sure gains are happening at the suggested rate and that you and your baby are growing normally.   Sometimes, an ultrasound is performed to confirm the position and the proper growth and development of the baby. This is a test done that bounces harmless sound waves off the baby so your caregiver can more accurately determine due dates.   Discuss the type of pain  medication and anesthesia you will have during your labor and delivery.   Discuss the possibility and anesthesia if a Cesarean Section might be necessary.   Inform your caregiver if there is any mental or physical violence at home.  Sometimes, a specialized non-stress test, contraction stress test and biophysical profile are done to make sure the baby is not having a problem. Checking the amniotic fluid surrounding the baby is called an amniocentesis. The amniotic fluid is removed by sticking a needle into the belly (abdomen). This is sometimes done near the end of pregnancy if an early delivery is required. In this case, it is done to help make sure the baby's lungs are mature enough for the baby to live outside of the womb. If the lungs are not mature and it is unsafe to deliver the baby, an injection of cortisone medication is given to the mother 1 to 2 days before the delivery. This helps the baby's lungs mature and makes it safer to deliver the baby. CHANGES OCCURING IN THE THIRD TRIMESTER OF PREGNANCY Your body goes through many changes during pregnancy. They vary from person to person. Talk to your caregiver about changes you notice and are concerned about.  During the last trimester, you have probably had an increase in your appetite. It is normal to have cravings for certain foods. This varies from person to person and pregnancy to pregnancy.   You may begin to get stretch marks on your hips,   abdomen, and breasts. These are normal changes in the body during pregnancy. There are no exercises or medications to take which prevent this change.   Constipation may be treated with a stool softener or adding bulk to your diet. Drinking lots of fluids, fiber in vegetables, fruits, and whole grains are helpful.   Exercising is also helpful. If you have been very active up until your pregnancy, most of these activities can be continued during your pregnancy. If you have been less active, it is helpful  to start an exercise program such as walking. Consult your caregiver before starting exercise programs.   Avoid all smoking, alcohol, un-prescribed drugs, herbs and "street drugs" during your pregnancy. These chemicals affect the formation and growth of the baby. Avoid chemicals throughout the pregnancy to ensure the delivery of a healthy infant.   Backache, varicose veins and hemorrhoids may develop or get worse.   You will tire more easily in the third trimester, which is normal.   The baby's movements may be stronger and more often.   You may become short of breath easily.   Your belly button may stick out.   A yellow discharge may leak from your breasts called colostrum.   You may have a bloody mucus discharge. This usually occurs a few days to a week before labor begins.  HOME CARE INSTRUCTIONS   Keep your caregiver's appointments. Follow your caregiver's instructions regarding medication use, exercise, and diet.   During pregnancy, you are providing food for you and your baby. Continue to eat regular, well-balanced meals. Choose foods such as meat, fish, milk and other low fat dairy products, vegetables, fruits, and whole-grain breads and cereals. Your caregiver will tell you of the ideal weight gain.   A physical sexual relationship may be continued throughout pregnancy if there are no other problems such as early (premature) leaking of amniotic fluid from the membranes, vaginal bleeding, or belly (abdominal) pain.   Exercise regularly if there are no restrictions. Check with your caregiver if you are unsure of the safety of your exercises. Greater weight gain will occur in the last 2 trimesters of pregnancy. Exercising helps:   Control your weight.   Get you in shape for labor and delivery.   You lose weight after you deliver.   Rest a lot with legs elevated, or as needed for leg cramps or low back pain.   Wear a good support or jogging bra for breast tenderness during  pregnancy. This may help if worn during sleep. Pads or tissues may be used in the bra if you are leaking colostrum.   Do not use hot tubs, steam rooms, or saunas.   Wear your seat belt when driving. This protects you and your baby if you are in an accident.   Avoid raw meat, cat litter boxes and soil used by cats. These carry germs that can cause birth defects in the baby.   It is easier to loose urine during pregnancy. Tightening up and strengthening the pelvic muscles will help with this problem. You can practice stopping your urination while you are going to the bathroom. These are the same muscles you need to strengthen. It is also the muscles you would use if you were trying to stop from passing gas. You can practice tightening these muscles up 10 times a set and repeating this about 3 times per day. Once you know what muscles to tighten up, do not perform these exercises during urination. It is more likely   to cause an infection by backing up the urine.   Ask for help if you have financial, counseling or nutritional needs during pregnancy. Your caregiver will be able to offer counseling for these needs as well as refer you for other special needs.   Make a list of emergency phone numbers and have them available.   Plan on getting help from family or friends when you go home from the hospital.   Make a trial run to the hospital.   Take prenatal classes with the father to understand, practice and ask questions about the labor and delivery.   Prepare the baby's room/nursery.   Do not travel out of the city unless it is absolutely necessary and with the advice of your caregiver.   Wear only low or no heal shoes to have better balance and prevent falling.  MEDICATIONS AND DRUG USE IN PREGNANCY  Take prenatal vitamins as directed. The vitamin should contain 1 milligram of folic acid. Keep all vitamins out of reach of children. Only a couple vitamins or tablets containing iron may be fatal  to a baby or young child when ingested.   Avoid use of all medications, including herbs, over-the-counter medications, not prescribed or suggested by your caregiver. Only take over-the-counter or prescription medicines for pain, discomfort, or fever as directed by your caregiver. Do not use aspirin, ibuprofen (Motrin, Advil, Nuprin) or naproxen (Aleve) unless OK'd by your caregiver.   Let your caregiver also know about herbs you may be using.   Alcohol is related to a number of birth defects. This includes fetal alcohol syndrome. All alcohol, in any form, should be avoided completely. Smoking will cause low birth rate and premature babies.   Street/illegal drugs are very harmful to the baby. They are absolutely forbidden. A baby born to an addicted mother will be addicted at birth. The baby will go through the same withdrawal an adult does.  SEEK MEDICAL CARE IF: You have any concerns or worries during your pregnancy. It is better to call with your questions if you feel they cannot wait, rather than worry about them. DECISIONS ABOUT CIRCUMCISION You may or may not know the sex of your baby. If you know your baby is a boy, it may be time to think about circumcision. Circumcision is the removal of the foreskin of the penis. This is the skin that covers the sensitive end of the penis. There is no proven medical need for this. Often this decision is made on what is popular at the time or based upon religious beliefs and social issues. You can discuss these issues with your caregiver or pediatrician. SEEK IMMEDIATE MEDICAL CARE IF:   An unexplained oral temperature above 102 F (38.9 C) develops, or as your caregiver suggests.   You have leaking of fluid from the vagina (birth canal). If leaking membranes are suspected, take your temperature and tell your caregiver of this when you call.   There is vaginal spotting, bleeding or passing clots. Tell your caregiver of the amount and how many pads are  used.   You develop a bad smelling vaginal discharge with a change in the color from clear to white.   You develop vomiting that lasts more than 24 hours.   You develop chills or fever.   You develop shortness of breath.   You develop burning on urination.   You loose more than 2 pounds of weight or gain more than 2 pounds of weight or as suggested by your   caregiver.   You notice sudden swelling of your face, hands, and feet or legs.   You develop belly (abdominal) pain. Round ligament discomfort is a common non-cancerous (benign) cause of abdominal pain in pregnancy. Your caregiver still must evaluate you.   You develop a severe headache that does not go away.   You develop visual problems, blurred or double vision.   If you have not felt your baby move for more than 1 hour. If you think the baby is not moving as much as usual, eat something with sugar in it and lie down on your left side for an hour. The baby should move at least 4 to 5 times per hour. Call right away if your baby moves less than that.   You fall, are in a car accident or any kind of trauma.   There is mental or physical violence at home.  Document Released: 09/15/2001 Document Revised: 06/03/2011 Document Reviewed: 03/20/2009 [employer] Patient Information 2012 [employer]. 

## 2011-08-20 NOTE — Progress Notes (Signed)
U/S Nov. 20, 2012 at 945 am scheduled.

## 2011-08-20 NOTE — Progress Notes (Signed)
Pulse 88. Swelling in feet and hands. Pelvic and vaginal pressure.

## 2011-08-20 NOTE — Progress Notes (Signed)
Still with daily H/A's and has note from [facility] re authorizing Fioricet. Rx 10 tabs breakthrough pain.  S<D, breech by exam. Schedule [place]- growth

## 2011-08-21 LAB — GC/CHLAMYDIA PROBE AMP, GENITAL: Chlamydia, DNA Probe: NEGATIVE

## 2011-08-21 LAB — RPR

## 2011-08-23 LAB — CULTURE, BETA STREP (GROUP B ONLY)

## 2011-08-24 ENCOUNTER — Other Ambulatory Visit: Payer: Self-pay | Admitting: Obstetrics and Gynecology

## 2011-08-24 DIAGNOSIS — O0990 Supervision of high risk pregnancy, unspecified, unspecified trimester: Secondary | ICD-10-CM

## 2011-08-24 MED ORDER — BUTALBITAL-APAP-CAFFEINE 50-325-40 MG PO TABS: 1.0000 | ORAL_TABLET | Freq: Four times a day (QID) | ORAL | Status: DC | PRN

## 2011-08-24 NOTE — Progress Notes (Signed)
Patient came to office requesting more Fioricet. Per Diedre Poe OK to Refill. Refill sent via e-prescribe. Patient to go to [facility] if needing more pain relief. Pt. Agrees.

## 2011-08-25 ENCOUNTER — Ambulatory Visit (HOSPITAL_COMMUNITY)
Admission: RE | Admit: 2011-08-25 | Discharge: 2011-08-25 | Disposition: A | Discharge status: Home / Self Care | Payer: Medicaid Other | Source: Ambulatory Visit | Attending: Obstetrics and Gynecology | Admitting: Obstetrics and Gynecology

## 2011-08-25 DIAGNOSIS — O99330 Smoking (tobacco) complicating pregnancy, unspecified trimester: Secondary | ICD-10-CM | POA: Insufficient documentation

## 2011-08-25 DIAGNOSIS — O365990 Maternal care for other known or suspected poor fetal growth, unspecified trimester, not applicable or unspecified: Secondary | ICD-10-CM | POA: Insufficient documentation

## 2011-08-25 DIAGNOSIS — O0990 Supervision of high risk pregnancy, unspecified, unspecified trimester: Secondary | ICD-10-CM

## 2011-08-31 ENCOUNTER — Ambulatory Visit (INDEPENDENT_AMBULATORY_CARE_PROVIDER_SITE_OTHER): Payer: Medicaid Other | Admitting: Obstetrics and Gynecology

## 2011-08-31 VITALS — BP 105/66 | Temp 96.9°F | Wt 151.1 lb

## 2011-08-31 DIAGNOSIS — O0930 Supervision of pregnancy with insufficient antenatal care, unspecified trimester: Secondary | ICD-10-CM

## 2011-08-31 DIAGNOSIS — O0990 Supervision of high risk pregnancy, unspecified, unspecified trimester: Secondary | ICD-10-CM

## 2011-08-31 LAB — POCT URINALYSIS DIP (DEVICE)
Bilirubin Urine: NEGATIVE
Glucose, UA: NEGATIVE mg/dL
Hgb urine dipstick: NEGATIVE
Leukocytes, UA: NEGATIVE
Nitrite: NEGATIVE
Urobilinogen, UA: 0.2 mg/dL (ref 0.0–1.0)
pH: 6.5 (ref 5.0–8.0)

## 2011-08-31 MED ORDER — GABAPENTIN 300 MG PO CAPS: 600.0000 mg | ORAL_CAPSULE | Freq: Three times a day (TID) | ORAL | Status: DC | PRN

## 2011-08-31 NOTE — Progress Notes (Signed)
Swelling in ankles, feet and hands.

## 2011-08-31 NOTE — Patient Instructions (Signed)
Pregnancy and Medications Most of the time, medicine a pregnant woman is taking does not enter the fetus, but sometimes it can. This may cause damage or birth defects. The risk of damage being done to a fetus is the greatest in the first few weeks of pregnancy. This is when major organs are developing. If you are taking any prescription, over-the-counter or herbal medicines, it is best to talk to your caregiver about the medications you are taking before getting pregnant. If you become pregnant, stop taking over-the-counter and herbal medications right away. Tell your caregiver what you were taking. Also, tell him/her medications, if any, you took before knowing you were pregnant. Never take any drugs during pregnancy unless your caregiver gives you permission. Other things like caffeine, vitamins, herbal teas and remedies can affect the growing fetus. Talk to your caregiver about cutting down on caffeine. Also, ask what type of vitamins you need to take. Never use any herbal product without talking to your caregiver first.  MEDICINES THAT ARE NOT SAFE TO TAKE DURING PREGNANCY   Category A - drugs that have been tested for safety during pregnancy and have been found to be safe. This includes drugs such as:   Folic acid.   Vitamin B6.   Thyroid medicine in moderation or in prescribed doses.   Category B - drugs that have been used a lot during pregnancy and do not appear to cause major birth defects or other problems. This includes drugs such as:   Some antibiotics.   Acetaminophen (Tylenol).   Aspartame (artificial sweetener).   Famotidine (Pepcid).   Prednisone (cortisone).   Insulin (for diabetes).   Ibuprofen (Advil, Motrin) before the third trimester. Pregnant women should not take ibuprofen during the last three months of pregnancy.   Category C - drugs that are more likely to cause problems for the mother or fetus. It also includes drugs for which safety studies have not been  finished. The majority of these drugs do not have safety studies in progress. These drugs often come with a warning that they should be used only if the benefits of taking them outweigh the risks. This is something a woman would need to carefully discuss with her caregiver. These drugs include:   Prochlorperzaine (Compazine).   Sudafed.   Fluconazole (Diflucan).   Ciprofloxacin (Cipro).   Some antidepressants are also included in this group.   Category D - drugs that have clear health risks for the fetus. They include:   Alcohol.   Lithium (used to treat manic depression).   Phenytoin (Dilantin).   Most chemotherapy drugs to treat cancer. In some cases, chemotherapy drugs are given during pregnancy.   Category X - drugs that have been shown to cause birth defects. They should never be taken during pregnancy. These include:   Drugs to treat skin conditions like cystic acne (Accutane) and psoriasis (Tegison or Soriatane).   Thalidomide (a sedative).   Diethylstilbestrol or DES.  No medication is considered 100% safe to take when pregnant because everyone reacts to drugs differently. Aspirin and other drugs containing salicylate are not recommended during pregnancy, especially during the last three months because it can cause bleeding. In rare cases, a woman's caregiver may want her to use these types of drugs under close watch. Acetylsalicylate, a common ingredient in many over-the-counter painkillers, may make a pregnancy last longer. It may cause severe bleeding before and after delivery.  SHOULD I AVOID TAKING ANY MEDICINE WHILE I AM PREGNANT?  Whether or  not you should continue taking medicine during pregnancy is a serious question. However, if you stop taking medicine that you need, this could harm both you and your baby. Talk to your caregiver about if the benefits outweigh the risk for you and your baby.  Pregnant and nursing women who need medication for psychiatric  conditions should consult with their obstetrician, pediatrician and mental health care provider before taking any medication. NATURAL MEDICATIONS OR HERBAL REMEDIES WHEN YOU ARE PREGNANT While some herbal remedies claim they will help with pregnancy, there are no studies to prove these claims are true. Likewise, there are very few studies to look at how safe and effective herbal remedies are during pregnancy. Do not take any herbal products without talking to your caregiver first. These products may contain agents that could harm you and the growing fetus. They could cause problems with your pregnancy.  If you think or know that your mother took diethylstilbesterol (DES), a factory made estrogen, when she was pregnant with you, talk with your caregiver right away. Ask her or him about:  What types of tests you may need.   How often they need to be done.   Anything else you may need to do to make sure you do not develop any problems.  A woman whose mother was given DES when pregnant should be followed and screened for abnormalities of her female organs all through her life. OVER-THE-COUNTER MEDICATIONS THAT ARE SAFE TO TAKE DURING PREGNANCY: Any medication taken during pregnancy should be taken only with the permission of your caregiver.  Allergy (Benadryl).   Cold and Flu, Tylenol (acetaminophen). Tylenol Cold, warm salt water gargles, saline nasal drops.   Constipation (Metamucil, Citrucil, Fiberall/Fobricon, Colace, Milk of Magnesia , Senekot).   Diarrhea (Kaopectate, Immodium, Parepectolin). You should not take these in the first trimester and only take the medication for 24 hours. Call your caregiver if you still have the diarrhea.   Headache (Tylenol).   Ointment for cuts and scrapes (J & J, Bacitracin, Neosporin).   Heartburn (Tums, Riopan, titralacT, Gaviscon).   Hemorrhoids (Preparation H, anusol, tucks, Witch hazel).   Nausea and vomiting (Vitamin B6 100mg   tablets, emetrol if you are not diabetic, Emetrex, sea bands).   Rashes (hydrocortisone cream or ointment, caladryl lotion or cream, benadryl cream or capsules, oatmeal bath).   Yeast infection of the vagina (Monistat cream or tablets, Terazol cream).  FOR MORE INFORMATION For more information regarding the medication you are taking and how it affects your pregnancy, go to [employer] [facility] Reference link: http://www.drugs.com/drug_information.html *Some information based on studies from The Food and Drug Administration (FDA). Document Released: 09/21/2005 Document Revised: 06/03/2011 Document Reviewed: 06/05/2009 [facility] Patient Information 2012 [place], [place].

## 2011-08-31 NOTE — Progress Notes (Signed)
Reviewed [place]: cephalic, 72nd %ile, AFI 11. H/A's improved. Neurontin refilled. GBS was neg.

## 2011-09-07 ENCOUNTER — Other Ambulatory Visit: Payer: Self-pay | Admitting: Obstetrics & Gynecology

## 2011-09-07 ENCOUNTER — Ambulatory Visit (INDEPENDENT_AMBULATORY_CARE_PROVIDER_SITE_OTHER): Payer: Medicaid Other | Admitting: Family Medicine

## 2011-09-07 ENCOUNTER — Encounter: Payer: Self-pay | Admitting: Obstetrics & Gynecology

## 2011-09-07 VITALS — BP 115/71 | HR 87 | Temp 97.2°F | Wt 151.9 lb

## 2011-09-07 DIAGNOSIS — F1920 Other psychoactive substance dependence, uncomplicated: Secondary | ICD-10-CM

## 2011-09-07 DIAGNOSIS — O0930 Supervision of pregnancy with insufficient antenatal care, unspecified trimester: Secondary | ICD-10-CM

## 2011-09-07 DIAGNOSIS — O0990 Supervision of high risk pregnancy, unspecified, unspecified trimester: Secondary | ICD-10-CM

## 2011-09-07 LAB — POCT URINALYSIS DIP (DEVICE)
Leukocytes, UA: NEGATIVE
Nitrite: NEGATIVE
Protein, ur: NEGATIVE mg/dL
pH: 7 (ref 5.0–8.0)

## 2011-09-07 MED ORDER — GABAPENTIN 300 MG PO CAPS: 600.0000 mg | ORAL_CAPSULE | Freq: Three times a day (TID) | ORAL | Status: DC | PRN

## 2011-09-07 MED ORDER — PRENATAL PLUS 27-1 MG PO TABS: 1.0000 | ORAL_TABLET | Freq: Every day | ORAL | Status: DC

## 2011-09-07 NOTE — Progress Notes (Signed)
Labor precautions, encouraged breast feeding. Refilled Neurontin for 1 mo.

## 2011-09-07 NOTE — Progress Notes (Signed)
Edema on hands, feet, ankles, legs No pain. Pressure on vaginal area.

## 2011-09-07 NOTE — Patient Instructions (Addendum)
Normal Labor and Delivery Your caregiver must first be sure you are in labor. Signs of labor include:  You may pass what is called "the mucus plug" before labor begins. This is a small amount of blood stained mucus.   Regular uterine contractions.   The time between contractions get closer together.   The discomfort and pain gradually gets more intense.   Pains are mostly located in the back.   Pains get worse when walking.   The cervix (the opening of the uterus becomes thinner (begins to efface) and opens up (dilates).  Once you are in labor and admitted into the hospital or care center, your caregiver will do the following:  A complete physical examination.   Check your vital signs (blood pressure, pulse, temperature and the fetal heart rate).   Do a vaginal examination (using a sterile glove and lubricant) to determine:   The position (presentation) of the baby (head [vertex] or buttock first).   The level (station) of the baby's head in the birth canal.   The effacement and dilatation of the cervix.   You may have your pubic hair shaved and be given an enema depending on your caregiver and the circumstance.   An electronic monitor is usually placed on your abdomen. The monitor follows the length and intensity of the contractions, as well as the baby's heart rate.   Usually, your caregiver will insert an IV in your arm with a bottle of sugar water. This is done as a precaution so that medications can be given to you quickly during labor or delivery.  NORMAL LABOR AND DELIVERY IS DIVIDED UP INTO 3 STAGES: First Stage This is when regular contractions begin and the cervix begins to efface and dilate. This stage can last from 3 to 15 hours. The end of the first stage is when the cervix is 100% effaced and 10 centimeters dilated. Pain medications may be given by   Injection (morphine, demerol, etc.)   Regional anesthesia (spinal, caudal or epidural, anesthetics given in  different locations of the spine). Paracervical pain medication may be given, which is an injection of and anesthetic on each side of the cervix.  A pregnant woman may request to have "Natural Childbirth" which is not to have any medications or anesthesia during her labor and delivery. Second Stage This is when the baby comes down through the birth canal (vagina) and is born. This can take 1 to 4 hours. As the baby's head comes down through the birth canal, you may feel like you are going to have a bowel movement. You will get the urge to bear down and push until the baby is delivered. As the baby's head is being delivered, the caregiver will decide if an episiotomy (a cut in the perineum and vagina area) is needed to prevent tearing of the tissue in this area. The episiotomy is sewn up after the delivery of the baby and placenta. Sometimes a mask with nitrous oxide is given for the mother to breath during the delivery of the baby to help if there is too much pain. The end of Stage 2 is when the baby is fully delivered. Then when the umbilical cord stops pulsating it is clamped and cut. Third Stage The third stage begins after the baby is completely delivered and ends after the placenta (afterbirth) is delivered. This usually takes 5 to 30 minutes. After the placenta is delivered, a medication is given either by intravenous or injection to help contract   the uterus and prevent bleeding. The third stage is not painful and pain medication is usually not necessary. If an episiotomy was done, it is repaired at this time. After the delivery, the mother is watched and monitored closely for 1 to 2 hours to make sure there is no postpartum bleeding (hemorrhage). If there is a lot of bleeding, medication is given to contract the uterus and stop the bleeding. Document Released: 06/30/2008 Document Revised: 06/03/2011 Document Reviewed: 06/30/2008 [facility] Patient Information 2012 [place], [place]. Pregnancy - Third  Trimester The third trimester of pregnancy (the last 3 months) is a period of the most rapid growth for you and your baby. The baby approaches a length of 20 inches and a weight of 6 to 10 pounds. The baby is adding on fat and getting ready for life outside your body. While inside, babies have periods of sleeping and waking, suck their thumbs, and hiccups. You can often feel small contractions of the uterus. This is false labor. It is also called Braxton-Hicks contractions. This is like a practice for labor. The usual problems in this stage of pregnancy include more difficulty breathing, swelling of the hands and feet from water retention, and having to urinate more often because of the uterus and baby pressing on your bladder.  PRENATAL EXAMS  Blood work may continue to be done during prenatal exams. These tests are done to check on your health and the probable health of your baby. Blood work is used to follow your blood levels (hemoglobin). Anemia (low hemoglobin) is common during pregnancy. Iron and vitamins are given to help prevent this. You may also continue to be checked for diabetes. Some of the past blood tests may be done again.   The size of the uterus is measured during each visit. This makes sure your baby is growing properly according to your pregnancy dates.   Your blood pressure is checked every prenatal visit. This is to make sure you are not getting toxemia.   Your urine is checked every prenatal visit for infection, diabetes and protein.   Your weight is checked at each visit. This is done to make sure gains are happening at the suggested rate and that you and your baby are growing normally.   Sometimes, an ultrasound is performed to confirm the position and the proper growth and development of the baby. This is a test done that bounces harmless sound waves off the baby so your caregiver can more accurately determine due dates.   Discuss the type of pain medication and anesthesia  you will have during your labor and delivery.   Discuss the possibility and anesthesia if a Cesarean Section might be necessary.   Inform your caregiver if there is any mental or physical violence at home.  Sometimes, a specialized non-stress test, contraction stress test and biophysical profile are done to make sure the baby is not having a problem. Checking the amniotic fluid surrounding the baby is called an amniocentesis. The amniotic fluid is removed by sticking a needle into the belly (abdomen). This is sometimes done near the end of pregnancy if an early delivery is required. In this case, it is done to help make sure the baby's lungs are mature enough for the baby to live outside of the womb. If the lungs are not mature and it is unsafe to deliver the baby, an injection of cortisone medication is given to the mother 1 to 2 days before the delivery. This helps the baby's lungs mature  and makes it safer to deliver the baby. CHANGES OCCURING IN THE THIRD TRIMESTER OF PREGNANCY Your body goes through many changes during pregnancy. They vary from person to person. Talk to your caregiver about changes you notice and are concerned about.  During the last trimester, you have probably had an increase in your appetite. It is normal to have cravings for certain foods. This varies from person to person and pregnancy to pregnancy.   You may begin to get stretch marks on your hips, abdomen, and breasts. These are normal changes in the body during pregnancy. There are no exercises or medications to take which prevent this change.   Constipation may be treated with a stool softener or adding bulk to your diet. Drinking lots of fluids, fiber in vegetables, fruits, and whole grains are helpful.   Exercising is also helpful. If you have been very active up until your pregnancy, most of these activities can be continued during your pregnancy. If you have been less active, it is helpful to start an exercise  program such as walking. Consult your caregiver before starting exercise programs.   Avoid all smoking, alcohol, un-prescribed drugs, herbs and "street drugs" during your pregnancy. These chemicals affect the formation and growth of the baby. Avoid chemicals throughout the pregnancy to ensure the delivery of a healthy infant.   Backache, varicose veins and hemorrhoids may develop or get worse.   You will tire more easily in the third trimester, which is normal.   The baby's movements may be stronger and more often.   You may become short of breath easily.   Your belly button may stick out.   A yellow discharge may leak from your breasts called colostrum.   You may have a bloody mucus discharge. This usually occurs a few days to a week before labor begins.  HOME CARE INSTRUCTIONS   Keep your caregiver's appointments. Follow your caregiver's instructions regarding medication use, exercise, and diet.   During pregnancy, you are providing food for you and your baby. Continue to eat regular, well-balanced meals. Choose foods such as meat, fish, milk and other low fat dairy products, vegetables, fruits, and whole-grain breads and cereals. Your caregiver will tell you of the ideal weight gain.   A physical sexual relationship may be continued throughout pregnancy if there are no other problems such as early (premature) leaking of amniotic fluid from the membranes, vaginal bleeding, or belly (abdominal) pain.   Exercise regularly if there are no restrictions. Check with your caregiver if you are unsure of the safety of your exercises. Greater weight gain will occur in the last 2 trimesters of pregnancy. Exercising helps:   Control your weight.   Get you in shape for labor and delivery.   You lose weight after you deliver.   Rest a lot with legs elevated, or as needed for leg cramps or low back pain.   Wear a good support or jogging bra for breast tenderness during pregnancy. This may help  if worn during sleep. Pads or tissues may be used in the bra if you are leaking colostrum.   Do not use hot tubs, steam rooms, or saunas.   Wear your seat belt when driving. This protects you and your baby if you are in an accident.   Avoid raw meat, cat litter boxes and soil used by cats. These carry germs that can cause birth defects in the baby.   It is easier to loose urine during pregnancy. Tightening up   and strengthening the pelvic muscles will help with this problem. You can practice stopping your urination while you are going to the bathroom. These are the same muscles you need to strengthen. It is also the muscles you would use if you were trying to stop from passing gas. You can practice tightening these muscles up 10 times a set and repeating this about 3 times per day. Once you know what muscles to tighten up, do not perform these exercises during urination. It is more likely to cause an infection by backing up the urine.   Ask for help if you have financial, counseling or nutritional needs during pregnancy. Your caregiver will be able to offer counseling for these needs as well as refer you for other special needs.   Make a list of emergency phone numbers and have them available.   Plan on getting help from family or friends when you go home from the hospital.   Make a trial run to the hospital.   Take prenatal classes with the father to understand, practice and ask questions about the labor and delivery.   Prepare the baby's room/nursery.   Do not travel out of the city unless it is absolutely necessary and with the advice of your caregiver.   Wear only low or no heal shoes to have better balance and prevent falling.  MEDICATIONS AND DRUG USE IN PREGNANCY  Take prenatal vitamins as directed. The vitamin should contain 1 milligram of folic acid. Keep all vitamins out of reach of children. Only a couple vitamins or tablets containing iron may be fatal to a baby or young child  when ingested.   Avoid use of all medications, including herbs, over-the-counter medications, not prescribed or suggested by your caregiver. Only take over-the-counter or prescription medicines for pain, discomfort, or fever as directed by your caregiver. Do not use aspirin, ibuprofen (Motrin, Advil, Nuprin) or naproxen (Aleve) unless OK'd by your caregiver.   Let your caregiver also know about herbs you may be using.   Alcohol is related to a number of birth defects. This includes fetal alcohol syndrome. All alcohol, in any form, should be avoided completely. Smoking will cause low birth rate and premature babies.   Street/illegal drugs are very harmful to the baby. They are absolutely forbidden. A baby born to an addicted mother will be addicted at birth. The baby will go through the same withdrawal an adult does.  SEEK MEDICAL CARE IF: You have any concerns or worries during your pregnancy. It is better to call with your questions if you feel they cannot wait, rather than worry about them. DECISIONS ABOUT CIRCUMCISION You may or may not know the sex of your baby. If you know your baby is a boy, it may be time to think about circumcision. Circumcision is the removal of the foreskin of the penis. This is the skin that covers the sensitive end of the penis. There is no proven medical need for this. Often this decision is made on what is popular at the time or based upon religious beliefs and social issues. You can discuss these issues with your caregiver or pediatrician. SEEK IMMEDIATE MEDICAL CARE IF:   An unexplained oral temperature above 102 F (38.9 C) develops, or as your caregiver suggests.   You have leaking of fluid from the vagina (birth canal). If leaking membranes are suspected, take your temperature and tell your caregiver of this when you call.   There is vaginal spotting, bleeding or passing clots.   Tell your caregiver of the amount and how many pads are used.   You develop a  bad smelling vaginal discharge with a change in the color from clear to white.   You develop vomiting that lasts more than 24 hours.   You develop chills or fever.   You develop shortness of breath.   You develop burning on urination.   You loose more than 2 pounds of weight or gain more than 2 pounds of weight or as suggested by your caregiver.   You notice sudden swelling of your face, hands, and feet or legs.   You develop belly (abdominal) pain. Round ligament discomfort is a common non-cancerous (benign) cause of abdominal pain in pregnancy. Your caregiver still must evaluate you.   You develop a severe headache that does not go away.   You develop visual problems, blurred or double vision.   If you have not felt your baby move for more than 1 hour. If you think the baby is not moving as much as usual, eat something with sugar in it and lie down on your left side for an hour. The baby should move at least 4 to 5 times per hour. Call right away if your baby moves less than that.   You fall, are in a car accident or any kind of trauma.   There is mental or physical violence at home.  Document Released: 09/15/2001 Document Revised: 06/03/2011 Document Reviewed: 03/20/2009 [facility] Patient Information 2012 [place], [place]. Birth Control Choices Birth control is the use of any practices, methods, or devices to prevent pregnancy from happening in a sexually active woman.  Below are some birth control choices to help avoid pregnancy.  Not having sex (abstinence) is the surest form of birth control. This requires self-control. There is no risk of acquiring a sexually transmitted disease (STD), including acquired immunodeficiency syndrome (AIDS).   Periodic abstinence requires self-control during certain times of the month.   Calendar method, timing your menstrual periods from month to month.   Ovulation method is avoiding sexual intercourse around the time you produce an egg  (ovulate).   Symptotherm method is avoiding sexual intercourse at the time of ovulation, using a thermometer and ovulation symptoms.   Post ovulation method is the timing of sexual intercourse after you ovulated.  These methods do not protect against STDs, including AIDS.  Birth control pills (BCPs) contain estrogen and progesterone hormone. These medicines work by stopping the egg from forming in the ovary (ovulation). Birth control pills are prescribed by a caregiver who will ask you questions about the risks of taking BCPs. Birth control pills do not protect against STDs, including AIDS.   "Minipill" birth control pills have only the progesterone hormone. They are taken every day of each month and must be prescribed by your caregiver. They do not protect against STDs, including AIDS.   Emergency contraception is often call the "morning after" pill. This pill can be taken right after sex or up to five days after sex if you think your birth control failed, you failed to use contraception, or you were forced to have sex. It is most effective the sooner you take the pills after having sexual intercourse. Do not use emergency contraception as your only form of birth control. Emergency contraceptive pills are available without a prescription. Check with your pharmacist.   Condoms are a thin sheath of latex, synthetic material, or lambskin worn over the penis during sexual intercourse. They can have a spermicide  in or on them when you buy them. Latex condoms can prevent pregnancy and STDs. "Natural" or lambskin condoms can prevent pregnancy but may not protect against STDs, including AIDS.   Female condoms are a soft, loose-fitting sheath that is put into the vagina before sexual intercourse. They can prevent pregnancy and STDs, including AIDS.   Sponge is a soft, circular piece of polyurethane foam with spermicide in it that is inserted into the vagina after wetting it and before sexual intercourse. It  does not require a prescription from your caregiver. It does not protect against STDs, including AIDS.   Diaphragm is a soft, latex, dome-shaped barrier that must be fitted by a caregiver. It is inserted into the vagina, along with a spermicidal jelly. After the proper fitting for a diaphragm, always insert the diaphragm before intercourse. The diaphragm should be left in the vagina for 6 to 8 hours after intercourse. Removal and reinsertion with a spermicide is always necessary after any use. It does not protect against STDs, including AIDS.   Progesterone-only injections are given every 3 months to prevent pregnancy. These injections contain synthetic progesterone and no estrogen. This hormone stops the ovaries from releasing eggs. It also causes the cervical mucus to thicken and changes the uterine lining. This makes it harder for sperm to survive in the uterus. It does not protect against STDs, including AIDS.   Birth Control Patch contains hormones similar to those in birth control pills, so effectiveness, risks, and side effects are similar. It must be changed once a week and is prescribed by a caregiver. It is less effective in very overweight women. It does not protect against STDs, including AIDS.   Vaginal Ring contains hormones similar to those in birth control pills. It is left in place for 3 weeks, removed for 1 week, and then a new one is put back into the vagina. It comes with a timer to put in your purse to help you remember when to take it out or put a new one in. A caregiver's examination and prescription is necessary, just like with birth control pills and the patch. It does not protect against STDs, including AIDS.   Estrogen plus progesterone injections are given every 28 to 30 days. They can be given in the upper arm, thigh, or buttocks. It does not protect against STDs, including AIDS.   Intrauterine device (IUD): copper T or progestin filled is a T-shaped device that is put in a  woman's uterus during a menstrual period to prevent pregnancy. The copper T IUD can last 10 years, and the progestin IUD can last 5 years. The progestin IUD can also help control heavy menstrual periods. It does not protect against STDs, including AIDS. The copper T IUD can be used as emergency contraception if inserted within 5 days of having unprotected intercourse.   Cervical cap is a round, soft latex or plastic cup that fits over the cervix and must be fitted by a caregiver. You do not need to use a spermicide with it or remove and insert it every time you have sexual intercourse. It does not protect against STDs, including AIDS.   Spermicides are chemicals that kill or block sperm from entering the cervix and uterus. They come in the form of creams, jellies, suppositories, foam, or tablets, and they do not require a prescription. They are inserted into the vagina with an applicator before having sexual intercourse. This must be repeated every time you have sexual intercourse.  Withdrawal is using the method of the female withdrawing his penis from sexual intercourse before he has a climax and deposits his sperm. It does not protect against STDs, including AIDS.   Female tubal ligation is when the woman's fallopian tubes are surgically sealed or tied to prevent the egg from traveling to the uterus. It does not protect against STDs, including AIDS.   Female sterilization is when the female has his tubes that carry sperm tied off (vasectomy) to stop sperm from entering the vagina during sexual intercourse. It does not protect against STDs, including AIDS.  Regardless of which method of birth control you choose, it is still important that you use some form of protection against STDs. Document Released: 09/21/2005 Document Revised: 10/24/2010 Document Reviewed: 08/08/2009 [facility] Patient Information 2012 [place], [place]. Breastfeeding BENEFITS OF BREASTFEEDING For the baby  The first milk (colostrum)  helps the baby's digestive system function better.   There are antibodies from the mother in the milk that help the baby fight off infections.   The baby has a lower incidence of asthma, allergies, and SIDS (sudden infant death syndrome).   The nutrients in breast milk are better than formulas for the baby and helps the baby's brain grow better.   Babies who breastfeed have less gas, colic, and constipation.  For the mother  Breastfeeding helps develop a very special bond between mother and baby.   It is more convenient, always available at the correct temperature and cheaper than formula feeding.   It burns calories in the mother and helps with losing weight that was gained during pregnancy.   It makes the uterus contract back down to normal size faster and slows bleeding following delivery.   Breastfeeding mothers have a lower risk of developing breast cancer.  NURSE FREQUENTLY  A healthy, full-term baby may breastfeed as often as every hour or space his or her feedings to every 3 hours.   How often to nurse will vary from baby to baby. Watch your baby for signs of hunger, not the clock.   Nurse as often as the baby requests, or when you feel the need to reduce the fullness of your breasts.   Awaken the baby if it has been 3 to 4 hours since the last feeding.   Frequent feeding will help the mother make more milk and will prevent problems like sore nipples and engorgement of the breasts.  BABY'S POSITION AT THE BREAST  Whether lying down or sitting, be sure that the baby's tummy is facing your tummy.   Support the breast with 4 fingers underneath the breast and the thumb above. Make sure your fingers are well away from the nipple and baby's mouth.   Stroke the baby's lips and cheek closest to the breast gently with your finger or nipple.   When the baby's mouth is open wide enough, place all of your nipple and as much of the dark area around the nipple as possible into your  baby's mouth.   Pull the baby in close so the tip of the nose and the baby's cheeks touch the breast during the feeding.  FEEDINGS  The length of each feeding varies from baby to baby and from feeding to feeding.   The baby must suck about 2 to 3 minutes for your milk to get to him or her. This is called a "let down." For this reason, allow the baby to feed on each breast as long as he or she wants. Your  baby will end the feeding when he or she has received the right balance of nutrients.   To break the suction, put your finger into the corner of the baby's mouth and slide it between his or her gums before removing your breast from his or her mouth. This will help prevent sore nipples.  REDUCING BREAST ENGORGEMENT  In the first week after your baby is born, you may experience signs of breast engorgement. When breasts are engorged, they feel heavy, warm, full, and may be tender to the touch. You can reduce engorgement if you:   Nurse frequently, every 2 to 3 hours. Mothers who breastfeed early and often have fewer problems with engorgement.   Place light ice packs on your breasts between feedings. This reduces swelling. Wrap the ice packs in a lightweight towel to protect your skin.   Apply moist hot packs to your breast for 5 to 10 minutes before each feeding. This increases circulation and helps the milk flow.   Gently massage your breast before and during the feeding.   Make sure that the baby empties at least one breast at every feeding before switching sides.   Use a breast pump to empty the breasts if your baby is sleepy or not nursing well. You may also want to pump if you are returning to work or or you feel you are getting engorged.   Avoid bottle feeds, pacifiers or supplemental feedings of water or juice in place of breastfeeding.   Be sure the baby is latched on and positioned properly while breastfeeding.   Prevent fatigue, stress, and anemia.   Wear a supportive bra,  avoiding underwire styles.   Eat a balanced diet with enough fluids.  If you follow these suggestions, your engorgement should improve in 24 to 48 hours. If you are still experiencing difficulty, call your lactation consultant or caregiver. IS MY BABY GETTING ENOUGH MILK? Sometimes, mothers worry about whether their babies are getting enough milk. You can be assured that your baby is getting enough milk if:  The baby is actively sucking and you hear swallowing.   The baby nurses at least 8 to 12 times in a 24 hour time period. Nurse your baby until he or she unlatches or falls asleep at the first breast (at least 10 to 20 minutes), then offer the second side.   The baby is wetting 5 to 6 disposable diapers (6 to 8 cloth diapers) in a 24 hour period by 34 to 35 days of age.   The baby is having at least 2 to 3 stools every 24 hours for the first few months. Breast milk is all the food your baby needs. It is not necessary for your baby to have water or formula. In fact, to help your breasts make more milk, it is best not to give your baby supplemental feedings during the early weeks.   The stool should be soft and yellow.   The baby should gain 4 to 7 ounces per week after he is 100 days old.  TAKE CARE OF YOURSELF Take care of your breasts by:  Bathing or showering daily.   Avoiding the use of soaps on your nipples.   Start feedings on your left breast at one feeding and on your right breast at the next feeding.   You will notice an increase in your milk supply 2 to 5 days after delivery. You may feel some discomfort from engorgement, which makes your breasts very firm and often  tender. Engorgement "peaks" out within 24 to 48 hours. In the meantime, apply warm moist towels to your breasts for 5 to 10 minutes before feeding. Gentle massage and expression of some milk before feeding will soften your breasts, making it easier for your baby to latch on. Wear a well fitting nursing bra and air dry  your nipples for 10 to 15 minutes after each feeding.   Only use cotton bra pads.   Only use pure lanolin on your nipples after nursing. You do not need to wash it off before nursing.  Take care of yourself by:   Eating well-balanced meals and nutritious snacks.   Drinking milk, fruit juice, and water to satisfy your thirst (about 8 glasses a day).   Getting plenty of rest.   Increasing calcium in your diet (1200 mg a day).   Avoiding foods that you notice affect the baby in a bad way.  SEEK MEDICAL CARE IF:   You have any questions or difficulty with breastfeeding.   You need help.   You have a hard, red, sore area on your breast, accompanied by a fever of 100.5 F (38.1 C) or more.   Your baby is too sleepy to eat well or is having trouble sleeping.   Your baby is wetting less than 6 diapers per day, by 83 days of age.   Your baby's skin or white part of his or her eyes is more yellow than it was in the hospital.   You feel depressed.  Document Released: 09/21/2005 Document Revised: 06/03/2011 Document Reviewed: 05/06/2009 [facility] Patient Information 2012 [place], [place].

## 2011-09-10 ENCOUNTER — Inpatient Hospital Stay (HOSPITAL_COMMUNITY)
Admission: AD | Admit: 2011-09-10 | Discharge: 2011-09-10 | Disposition: A | Discharge status: Home / Self Care | Payer: Medicaid Other | Source: Ambulatory Visit | Attending: Obstetrics & Gynecology | Admitting: Obstetrics & Gynecology

## 2011-09-10 ENCOUNTER — Encounter (HOSPITAL_COMMUNITY): Payer: Self-pay | Admitting: *Deleted

## 2011-09-10 DIAGNOSIS — O479 False labor, unspecified: Secondary | ICD-10-CM | POA: Insufficient documentation

## 2011-09-10 MED ORDER — OXYCODONE-ACETAMINOPHEN 5-325 MG PO TABS
1.0000 | ORAL_TABLET | Freq: Once | ORAL | Status: AC
  Administered 2011-09-10: 1 via ORAL
  Filled 2011-09-10: qty 1

## 2011-09-10 NOTE — Progress Notes (Signed)
Patient state she is having consistent contractions. No leaking or bleeding and reports good fetal movement.

## 2011-09-10 NOTE — ED Provider Notes (Signed)
Caitlin Berger is a 25 y.o. year old [id number] female at [redacted]w[redacted]d weeks gestation who presents to MAU reporting strong contractions x 2 hours. She reports po FM and denies vaginal bleeding or leaking of fluid.   History OB History    Grav Para Term Preterm Abortions TAB SAB Ect Mult Living   4 2 2  1 1    2      Past Medical History  Diagnosis Date  . Ovarian cyst   . History of suicide attempt   . Asthma   . Migraines   . Mental disorder     anxiety disorder  . ADHD (attention deficit hyperactivity disorder)    Past Surgical History  Procedure Date  . Head surgery    Family History: family history includes Asthma in her brother; Bipolar disorder in her brother; Diabetes in her father; and Hypertension in her maternal grandmother and mother. Social History:  reports that she has been smoking.  She does not have any smokeless tobacco history on file. She reports that she does not drink alcohol or use illicit drugs.  ROS: Otherwise neg  Dilation: 1 Effacement (%): 30 Station: -3 Exam by:: VKatrinka Blazing CNM Blood pressure 129/68, pulse 105, temperature 98.3 F (36.8 C), temperature source Oral, resp. rate 20, height 5\' 5"  (1.651 m), weight 67.767 kg (149 lb 6.4 oz), last menstrual period 11/21/2010, SpO2 98.00%, unknown if currently breastfeeding. Exam Physical Exam  FHR category I  Prenatal labs: ABO, Rh: O/Positive/-- (09/04 0000) Antibody: Negative (09/04 0000) Rubella: Immune (09/04 0000) RPR: NON REAC (11/15 1053)  HBsAg: Negative (09/04 0000)  HIV: Non-reactive (09/04 0000)  GBS: Negative (11/15 0000)   Assessment/Plan: Care assumed by Wynelle Bourgeois, CNM at [address], [place] 09/10/2011, 3:01 PM    Assumed care:  Cervix unchanged. FHR reactive. UCs q 3-4 minutes. Membranes swept to enhance labor pattern per pt request. Will give one Percocet before d/c . Will give school note for tomorrow.

## 2011-09-11 ENCOUNTER — Encounter (HOSPITAL_COMMUNITY): Payer: Self-pay | Admitting: Anesthesiology

## 2011-09-11 ENCOUNTER — Inpatient Hospital Stay (HOSPITAL_COMMUNITY)
Admission: AD | Admit: 2011-09-11 | Discharge: 2011-09-12 | DRG: 775 | Disposition: A | Discharge status: Home / Self Care | Payer: Medicaid Other | Source: Ambulatory Visit | Attending: Obstetrics & Gynecology | Admitting: Obstetrics & Gynecology

## 2011-09-11 ENCOUNTER — Encounter (HOSPITAL_COMMUNITY): Payer: Self-pay

## 2011-09-11 ENCOUNTER — Inpatient Hospital Stay (HOSPITAL_COMMUNITY): Payer: Medicaid Other | Admitting: Anesthesiology

## 2011-09-11 DIAGNOSIS — O0990 Supervision of high risk pregnancy, unspecified, unspecified trimester: Secondary | ICD-10-CM

## 2011-09-11 DIAGNOSIS — Z3490 Encounter for supervision of normal pregnancy, unspecified, unspecified trimester: Secondary | ICD-10-CM

## 2011-09-11 DIAGNOSIS — O99344 Other mental disorders complicating childbirth: Secondary | ICD-10-CM

## 2011-09-11 DIAGNOSIS — F319 Bipolar disorder, unspecified: Secondary | ICD-10-CM

## 2011-09-11 HISTORY — DX: Major depressive disorder, single episode, unspecified: F32.9

## 2011-09-11 HISTORY — DX: Anxiety disorder, unspecified: F41.9

## 2011-09-11 HISTORY — DX: Reserved for concepts with insufficient information to code with codable children: IMO0002

## 2011-09-11 HISTORY — DX: Depression, unspecified: F32.A

## 2011-09-11 LAB — CBC
MCH: 31 pg (ref 26.0–34.0)
MCHC: 33.9 g/dL (ref 30.0–36.0)
MCV: 91.3 fL (ref 78.0–100.0)
Platelets: 368 10*3/uL (ref 150–400)
RBC: 3.81 MIL/uL — ABNORMAL LOW (ref 3.87–5.11)
RDW: 12.4 % (ref 11.5–15.5)

## 2011-09-11 MED ORDER — CITALOPRAM HYDROBROMIDE 20 MG PO TABS
20.0000 mg | ORAL_TABLET | Freq: Every day | ORAL | Status: DC
  Filled 2011-09-11 (×2): qty 1

## 2011-09-11 MED ORDER — DIBUCAINE 1 % RE OINT: 1.0000 "application " | TOPICAL_OINTMENT | RECTAL | Status: DC | PRN

## 2011-09-11 MED ORDER — BENZOCAINE-MENTHOL 20-0.5 % EX AERO
INHALATION_SPRAY | CUTANEOUS | Status: AC
  Administered 2011-09-11: 22:00:00
  Filled 2011-09-11: qty 56

## 2011-09-11 MED ORDER — LACTATED RINGERS IV SOLN
500.0000 mL | Freq: Once | INTRAVENOUS | Status: AC
  Administered 2011-09-11: 500 mL via INTRAVENOUS

## 2011-09-11 MED ORDER — NALBUPHINE SYRINGE 5 MG/0.5 ML
INJECTION | INTRAMUSCULAR | Status: AC
  Administered 2011-09-11: 10 mg via INTRAVENOUS
  Filled 2011-09-11: qty 0.5

## 2011-09-11 MED ORDER — BENZOCAINE-MENTHOL 20-0.5 % EX AERO: 1.0000 "application " | INHALATION_SPRAY | CUTANEOUS | Status: DC | PRN

## 2011-09-11 MED ORDER — OXYCODONE-ACETAMINOPHEN 5-325 MG PO TABS
2.0000 | ORAL_TABLET | ORAL | Status: DC | PRN
  Administered 2011-09-11: 2 via ORAL
  Filled 2011-09-11: qty 2

## 2011-09-11 MED ORDER — SENNOSIDES-DOCUSATE SODIUM 8.6-50 MG PO TABS
2.0000 | ORAL_TABLET | Freq: Every day | ORAL | Status: DC
  Administered 2011-09-11: 2 via ORAL

## 2011-09-11 MED ORDER — DIPHENHYDRAMINE HCL 50 MG/ML IJ SOLN: 12.5000 mg | INTRAMUSCULAR | Status: DC | PRN

## 2011-09-11 MED ORDER — NALBUPHINE SYRINGE 5 MG/0.5 ML
10.0000 mg | INJECTION | INTRAMUSCULAR | Status: DC | PRN
  Administered 2011-09-11: 10 mg via INTRAVENOUS
  Filled 2011-09-11: qty 0.5

## 2011-09-11 MED ORDER — PRENATAL PLUS 27-1 MG PO TABS
1.0000 | ORAL_TABLET | Freq: Every day | ORAL | Status: DC
  Administered 2011-09-12: 1 via ORAL
  Filled 2011-09-11: qty 1

## 2011-09-11 MED ORDER — NICOTINE 7 MG/24HR TD PT24
7.0000 mg | MEDICATED_PATCH | Freq: Every day | TRANSDERMAL | Status: DC
  Administered 2011-09-11: 7 mg via TRANSDERMAL
  Filled 2011-09-11 (×2): qty 1

## 2011-09-11 MED ORDER — FENTANYL 2.5 MCG/ML BUPIVACAINE 1/10 % EPIDURAL INFUSION (WH - ANES)
14.0000 mL/h | INTRAMUSCULAR | Status: DC
  Administered 2011-09-11 (×2): 14 mL/h via EPIDURAL
  Filled 2011-09-11 (×3): qty 60

## 2011-09-11 MED ORDER — LAMOTRIGINE 25 MG PO TABS
25.0000 mg | ORAL_TABLET | Freq: Every day | ORAL | Status: DC
  Filled 2011-09-11 (×2): qty 1

## 2011-09-11 MED ORDER — FLEET ENEMA 7-19 GM/118ML RE ENEM: 1.0000 | ENEMA | RECTAL | Status: DC | PRN

## 2011-09-11 MED ORDER — LACTATED RINGERS IV SOLN: 500.0000 mL | INTRAVENOUS | Status: DC | PRN

## 2011-09-11 MED ORDER — LIDOCAINE HCL (PF) 1 % IJ SOLN
30.0000 mL | INTRAMUSCULAR | Status: DC | PRN
  Filled 2011-09-11: qty 30

## 2011-09-11 MED ORDER — ACETAMINOPHEN 325 MG PO TABS: 650.0000 mg | ORAL_TABLET | ORAL | Status: DC | PRN

## 2011-09-11 MED ORDER — WITCH HAZEL-GLYCERIN EX PADS: 1.0000 "application " | MEDICATED_PAD | CUTANEOUS | Status: DC | PRN

## 2011-09-11 MED ORDER — LACTATED RINGERS IV SOLN
INTRAVENOUS | Status: DC
  Administered 2011-09-11: 125 mL/h via INTRAVENOUS
  Administered 2011-09-11: 15:00:00 via INTRAVENOUS
  Administered 2011-09-11: 125 mL/h via INTRAVENOUS

## 2011-09-11 MED ORDER — FENTANYL 2.5 MCG/ML BUPIVACAINE 1/10 % EPIDURAL INFUSION (WH - ANES)
INTRAMUSCULAR | Status: DC | PRN
  Administered 2011-09-11: 14 mL/h via EPIDURAL

## 2011-09-11 MED ORDER — ZOLPIDEM TARTRATE 5 MG PO TABS: 5.0000 mg | ORAL_TABLET | Freq: Every evening | ORAL | Status: DC | PRN

## 2011-09-11 MED ORDER — PRENATAL PLUS 27-1 MG PO TABS: 1.0000 | ORAL_TABLET | Freq: Every day | ORAL | Status: DC

## 2011-09-11 MED ORDER — OXYTOCIN 20 UNITS IN LACTATED RINGERS INFUSION - SIMPLE
1.0000 m[IU]/min | INTRAVENOUS | Status: DC
  Administered 2011-09-11: 2 m[IU]/min via INTRAVENOUS
  Filled 2011-09-11: qty 1000

## 2011-09-11 MED ORDER — PHENYLEPHRINE 40 MCG/ML (10ML) SYRINGE FOR IV PUSH (FOR BLOOD PRESSURE SUPPORT): 80.0000 ug | PREFILLED_SYRINGE | INTRAVENOUS | Status: DC | PRN

## 2011-09-11 MED ORDER — IBUPROFEN 600 MG PO TABS
600.0000 mg | ORAL_TABLET | Freq: Four times a day (QID) | ORAL | Status: DC | PRN
  Administered 2011-09-11: 600 mg via ORAL
  Filled 2011-09-11: qty 1

## 2011-09-11 MED ORDER — GABAPENTIN 300 MG PO CAPS
600.0000 mg | ORAL_CAPSULE | Freq: Three times a day (TID) | ORAL | Status: DC | PRN
  Administered 2011-09-11: 600 mg via ORAL
  Filled 2011-09-11: qty 2

## 2011-09-11 MED ORDER — SIMETHICONE 80 MG PO CHEW: 80.0000 mg | CHEWABLE_TABLET | ORAL | Status: DC | PRN

## 2011-09-11 MED ORDER — OXYTOCIN BOLUS FROM INFUSION
500.0000 mL | Freq: Once | INTRAVENOUS | Status: AC
  Administered 2011-09-11: 500 mL via INTRAVENOUS
  Filled 2011-09-11: qty 500

## 2011-09-11 MED ORDER — TERBUTALINE SULFATE 1 MG/ML IJ SOLN: 0.2500 mg | Freq: Once | INTRAMUSCULAR | Status: DC | PRN

## 2011-09-11 MED ORDER — OXYCODONE-ACETAMINOPHEN 5-325 MG PO TABS
1.0000 | ORAL_TABLET | ORAL | Status: DC | PRN
  Administered 2011-09-11 – 2011-09-12 (×5): 2 via ORAL
  Filled 2011-09-11 (×5): qty 2

## 2011-09-11 MED ORDER — EPHEDRINE 5 MG/ML INJ: 10.0000 mg | INTRAVENOUS | Status: DC | PRN

## 2011-09-11 MED ORDER — TETANUS-DIPHTH-ACELL PERTUSSIS 5-2.5-18.5 LF-MCG/0.5 IM SUSP: 0.5000 mL | Freq: Once | INTRAMUSCULAR | Status: DC

## 2011-09-11 MED ORDER — CITRIC ACID-SODIUM CITRATE 334-500 MG/5ML PO SOLN: 30.0000 mL | ORAL | Status: DC | PRN

## 2011-09-11 MED ORDER — PHENYLEPHRINE 40 MCG/ML (10ML) SYRINGE FOR IV PUSH (FOR BLOOD PRESSURE SUPPORT)
80.0000 ug | PREFILLED_SYRINGE | INTRAVENOUS | Status: DC | PRN
  Filled 2011-09-11: qty 5

## 2011-09-11 MED ORDER — ONDANSETRON HCL 4 MG PO TABS: 4.0000 mg | ORAL_TABLET | ORAL | Status: DC | PRN

## 2011-09-11 MED ORDER — OXYTOCIN 20 UNITS IN LACTATED RINGERS INFUSION - SIMPLE
125.0000 mL/h | Freq: Once | INTRAVENOUS | Status: AC
  Administered 2011-09-11: 125 mL/h via INTRAVENOUS

## 2011-09-11 MED ORDER — ONDANSETRON HCL 4 MG/2ML IJ SOLN
4.0000 mg | Freq: Four times a day (QID) | INTRAMUSCULAR | Status: DC | PRN
  Administered 2011-09-11: 4 mg via INTRAVENOUS
  Filled 2011-09-11: qty 2

## 2011-09-11 MED ORDER — EPHEDRINE 5 MG/ML INJ
10.0000 mg | INTRAVENOUS | Status: DC | PRN
  Filled 2011-09-11: qty 4

## 2011-09-11 MED ORDER — LIDOCAINE HCL 1.5 % IJ SOLN
INTRAMUSCULAR | Status: DC | PRN
  Administered 2011-09-11 (×2): 5 mL via EPIDURAL

## 2011-09-11 MED ORDER — ONDANSETRON HCL 4 MG/2ML IJ SOLN: 4.0000 mg | INTRAMUSCULAR | Status: DC | PRN

## 2011-09-11 MED ORDER — DIPHENHYDRAMINE HCL 25 MG PO CAPS: 25.0000 mg | ORAL_CAPSULE | Freq: Four times a day (QID) | ORAL | Status: DC | PRN

## 2011-09-11 MED ORDER — LANOLIN HYDROUS EX OINT: TOPICAL_OINTMENT | CUTANEOUS | Status: DC | PRN

## 2011-09-11 MED ORDER — IBUPROFEN 600 MG PO TABS
600.0000 mg | ORAL_TABLET | Freq: Four times a day (QID) | ORAL | Status: DC
  Administered 2011-09-12 (×4): 600 mg via ORAL
  Filled 2011-09-11 (×4): qty 1

## 2011-09-11 NOTE — Anesthesia Postprocedure Evaluation (Signed)
Anesthesia Post Note  Patient: Caitlin Berger  Procedure(s) Performed: * No procedures listed *  Anesthesia type: Epidural  Patient location: Mother/Baby  Post pain: Pain level controlled  Post assessment: Post-op Vital signs reviewed  Last Vitals:  Filed Vitals:   09/11/11 1631  BP: 114/65  Pulse: 97  Temp:   Resp:     Post vital signs: Reviewed  Level of consciousness: awake  Complications: No apparent anesthesia complications

## 2011-09-11 NOTE — ED Notes (Signed)
Contacted [place] CNM regarding pain medication had previously spoken with Mollie Germany MD resident have not received orders and patient is very uncomfortable, CNM will come to check patient.
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
## 2011-09-11 NOTE — Progress Notes (Signed)
Subjective: Patient more comfortable with epidural.  Feels contractions  Objective: BP 99/77  Pulse 97  Temp(Src) 98.5 F (36.9 C) (Axillary)  Resp 18  Ht 5\' 5"  (1.651 m)  Wt 67.586 kg (149 lb)  BMI 24.79 kg/m2  SpO2 99%  LMP 11/21/2010  Breastfeeding? Unknown      FHT:  FHR: 120s-130s bpm, variability: moderate,  accelerations:  Present,  decelerations:  Absent UC:   regular, every 2-3 minutes SVE:   Dilation: 5 Effacement (%): 80 Station: -1 Exam by:: Dr Adrian Blackwater  Labs: Lab Results  Component Value Date   WBC 25.0* 09/11/2011   HGB 11.8* 09/11/2011   HCT 34.8* 09/11/2011   MCV 91.3 09/11/2011   PLT 368 09/11/2011    Assessment / Plan: Spontaneous labor, progressing normally  Labor: Progressing normally Fetal Wellbeing:  Category I Pain Control:  Epidural I/D:  n/a Anticipated MOD:  NSVD  Caitlin Berger JEHIEL 09/11/2011, 10:59 AM

## 2011-09-11 NOTE — Anesthesia Preprocedure Evaluation (Signed)
Anesthesia Evaluation  Patient identified by MRN, date of birth, ID band Patient awake    Reviewed: Allergy & Precautions, H&P , NPO status , Patient's Chart, lab work & pertinent test results  Airway Mallampati: I TM Distance: >3 FB Neck ROM: full    Dental No notable dental hx.    Pulmonary    Pulmonary exam normal       Cardiovascular neg cardio ROS     Neuro/Psych PSYCHIATRIC DISORDERS Anxiety Depression    GI/Hepatic negative GI ROS, Neg liver ROS,   Endo/Other  Negative Endocrine ROS  Renal/GU negative Renal ROS  Genitourinary negative   Musculoskeletal negative musculoskeletal ROS (+)   Abdominal Normal abdominal exam  (+)   Peds negative pediatric ROS (+)  Hematology negative hematology ROS (+)   Anesthesia Other Findings   Reproductive/Obstetrics (+) Pregnancy                           Anesthesia Physical Anesthesia Plan  ASA: II  Anesthesia Plan: Epidural   Post-op Pain Management:    Induction:   Airway Management Planned:   Additional Equipment:   Intra-op Plan:   Post-operative Plan:   Informed Consent: I have reviewed the patients History and Physical, chart, labs and discussed the procedure including the risks, benefits and alternatives for the proposed anesthesia with the patient or authorized representative who has indicated his/her understanding and acceptance.     Plan Discussed with:   Anesthesia Plan Comments:         Anesthesia Quick Evaluation

## 2011-09-11 NOTE — Progress Notes (Signed)
Patient states contractions about 5 minutes apart. No bleeding or leaking and has felt the baby move, not as much as usual.

## 2011-09-11 NOTE — H&P (Signed)
I have seen and examined this patient in conjunction with Dr Durene Cal, PGY1.  I have taken this history and performed the exam.  I agree with the note as written above and have made corrections as needed.   Candelaria Celeste JEHIEL 09/11/2011 9:46 AM

## 2011-09-11 NOTE — Anesthesia Procedure Notes (Addendum)
Epidural Patient location during procedure: OB Start time: 09/11/2011 9:43 AM End time: 09/11/2011 9:48 AM Reason for block: procedure for pain  Staffing Anesthesiologist: Sandrea Hughs Performed by: anesthesiologist   Preanesthetic Checklist Completed: patient identified, site marked, surgical consent, pre-op evaluation, timeout performed, IV checked, risks and benefits discussed and monitors and equipment checked  Epidural Patient position: sitting Prep: site prepped and draped and DuraPrep Patient monitoring: continuous pulse ox and blood pressure Approach: midline Injection technique: LOR air  Needle:  Needle type: Tuohy  Needle gauge: 17 G Needle length: 9 cm Needle insertion depth: 5 cm cm Catheter type: closed end flexible Catheter size: 19 Gauge Catheter at skin depth: 10 cm Test dose: negative and 1.5% lidocaine  Assessment Sensory level: T8 Events: blood not aspirated, injection not painful, no injection resistance, negative IV test and no paresthesia

## 2011-09-11 NOTE — Progress Notes (Signed)
Subjective: Comfortable with epidural  Objective: BP 114/58  Pulse 87  Temp(Src) 97.9 F (36.6 C) (Oral)  Resp 20  Ht 5\' 5"  (1.651 m)  Wt 67.586 kg (149 lb)  BMI 24.79 kg/m2  SpO2 99%  LMP 11/21/2010  Breastfeeding? Unknown   Total I/O In: -  Out: 250 [Urine:250]  FHT:  FHR: 130s bpm, variability: moderate,  accelerations:  Present,  decelerations:  Absent UC:   irregular, every 5 minutes SVE:   Dilation: 6 Effacement (%): 80 Station: -1 Exam by:: Dr Adrian Blackwater  Labs: Lab Results  Component Value Date   WBC 25.0* 09/11/2011   HGB 11.8* 09/11/2011   HCT 34.8* 09/11/2011   MCV 91.3 09/11/2011   PLT 368 09/11/2011    Assessment / Plan: AROM with moderate meconium.  Will start pitocin  Fetal Wellbeing:  Category I Pain Control:  Epidural I/D:  n/a Anticipated MOD:  NSVD  STINSON, JACOB JEHIEL 09/11/2011, 1:40 PM

## 2011-09-11 NOTE — ED Notes (Signed)
Notified resident that patient is 2/50/-1 intact, fhr reactive, contractions every 3 minutes will continue to monitor until midwife can come to MAU to evaluate patient.

## 2011-09-11 NOTE — Consult Note (Signed)
Called by Apgar page to maternal L/D room 166 arriving by report at 1 1/2 minutes of age of newborn. Infant lying under radiant warmer, partial flexion of lower extremities, apneic and with HR <100 by auscultation per RRT.  Given vigorous tactile stim with bulb suction of naro/oropharynx. History given of thick MSF at time of delivery; neither  infant nor umbilical cord meconium stained.  Laryngoscopy and intubation below cords with Delee Trap yielding no fluid and upper trachea appearing clear. Infant at this time had not received IPPV nor shown any spontaneous respiratory effort.   Returned to give tactile stim and then began bag/mask IPPV for ~ 1+ minutes with clear breath sounds to auscultation bilaterally.  Onset of spontaneous respiratory effort before 5 minutes of age. IPPV discontinued and BBO2 continued through 5 minutes to 8 minutes of age then discontinued. Infant had Delee suction to stomach which yielded ~ 3 ml of thick mucoid stained fluid. Continued observation until ten minutes of age after which infant was shown to mother.   Apgar scores of 2/6/9 given for 1, 5, and 10 minutes respectively. No dysmorphic features and no maternal history of diabetes mellitis nor narcotic analgesics nor tight nuchal cord at time of delivery. As noted there was no evidence of meconium staining.   Dagoberto Ligas MD [facility]

## 2011-09-12 DIAGNOSIS — Z3490 Encounter for supervision of normal pregnancy, unspecified, unspecified trimester: Secondary | ICD-10-CM

## 2011-09-12 MED ORDER — SENNOSIDES-DOCUSATE SODIUM 8.6-50 MG PO TABS: 2.0000 | ORAL_TABLET | Freq: Every day | ORAL | Status: DC

## 2011-09-12 MED ORDER — OXYCODONE-ACETAMINOPHEN 5-325 MG PO TABS: 1.0000 | ORAL_TABLET | Freq: Four times a day (QID) | ORAL | Status: DC | PRN

## 2011-09-12 MED ORDER — OXYCODONE-ACETAMINOPHEN 5-325 MG PO TABS: 1.0000 | ORAL_TABLET | Freq: Four times a day (QID) | ORAL | Status: AC | PRN

## 2011-09-12 NOTE — Discharge Instructions (Signed)
Postpartum Care After Vaginal Delivery  After you deliver your baby, you will stay in the hospital for 24 to 72 hours, unless there were problems with the labor or delivery, or you have medical problems. While you are in the hospital, you will receive help and instructions on how to care for yourself and your baby.  Your doctor will order pain medicine, in case you need it. You will have a small amount of bleeding from your vagina and should change your sanitary pad frequently. Wash your hands thoroughly with soap and water for at least 20 seconds after changing pads and using the toilet. Let the nurses know if you begin to pass blood clots or your bleeding increases. Do not flush blood clots down the toilet before having the nurse look at them, to make sure there is no placental tissue with them.  If you had an intravenous (IV), it will be removed within 24 hours, if there are no problems. The first time you get out of bed or take a shower, call the nurse to help you because you may get weak, lightheaded, or even faint. If you are breastfeeding, you may feel painful contractions of your uterus for a couple of weeks. This is normal. The contractions help your uterus get back to normal size. If you are not breastfeeding, wear a supportive bra and handle your breasts as little as possible until your milk has dried up. Hormones should not be given to dry up the breasts, because they can cause blood clots. You will be given your normal diet, unless you have diabetes or other medical problems.   The nurses may put an ice pack on your episiotomy (surgically enlarged opening), if you have one, to reduce the pain and swelling. On rare occasions, you may not be able to urinate and the nurse will need to empty your bladder with a catheter. If you had a postpartum tubal ligation ("tying tubes," female sterilization), it should not make your stay in the hospital longer.  You may have your baby in your room with you as much as  you like, unless you or the baby has a problem. Use the bassinet (basket) for the baby when going to and from the nursery. Do not carry the baby. Do not leave the postpartum area. If the mother is Rh negative (lacks a protein on the red blood cells) and the baby is Rh positive, the mother should get a Rho-gam shot to prevent Rh problems with future pregnancies.  You may be given written instructions for you and your baby, and necessary medicines, when you are discharged from the hospital. Be sure you understand and follow the instructions as advised.  HOME CARE INSTRUCTIONS   · Follow instructions and take the medicines given to you.   · Only take over-the-counter or prescription medicines for pain, discomfort, or fever as directed by your caregiver.   · Do not take aspirin, because it can cause bleeding.   · Increase your activities a little bit every day to build up your strength and endurance.   · Do not drink alcohol, especially if you are breastfeeding or taking pain medicine.   · Take your temperature twice a day and record it.   · You may have a small amount of bleeding or spotting for 2 to 4 weeks. This is normal.   · Do not use tampons or douche. Use sanitary pads.   · Try to have someone stay and help you for a   few days when you go home.   · Try to rest or take a nap when the baby is sleeping.   · If you are breastfeeding, wear a good support bra. If you are not breastfeeding, wear a supportive bra and do not stimulate your nipples.   · Eat a healthy, nutritious diet and continue to take your prenatal vitamins.   · Do not drive, do any heavy activities, or travel until your caregiver tells you it is okay.   · Do not have intercourse until your caregiver gives you permission to do so.   · Ask your caregiver when you can begin to exercise and what type of exercises to do.   · Call your caregiver if you think you are having a problem from your delivery.   · Call your pediatrician if you are having a problem  with the baby.   · Schedule your postpartum visit and keep it.   SEEK MEDICAL CARE IF:   · You have a temperature of 100° F (37.8° C) or higher.   · You have increased vaginal bleeding or are passing clots. Save any clots to show your caregiver.   · You have bloody urine or pain when you urinate.   · You have a bad smelling vaginal discharge.   · You have increasing pain or swelling on your episiotomy.   · You develop a severe headache.   · You feel depressed.   · The episiotomy is separating.   · You become dizzy or lightheaded.   · You develop a rash.   · You have a reaction or problems with your medicine.   · You have pain, redness, or swelling at the intravenous site.   SEEK IMMEDIATE MEDICAL CARE IF:   · You have chest pain.   · You develop shortness of breath.   · You pass out.   · You develop pain, with or without swelling or redness in your leg.   · You develop heavy vaginal bleeding, with or without blood clots.   · You develop stomach pain.   · You develop a bad smelling vaginal discharge.   MAKE SURE YOU:   · Understand these instructions.   · Will watch your condition.   · Will get help right away if you are not doing well or get worse.   Document Released: 07/19/2007 Document Revised: 06/03/2011 Document Reviewed: 07/31/2009  [employer]® Patient Information ©2012 [employer].

## 2011-09-12 NOTE — Progress Notes (Signed)
Post Partum Day 1  Subjective: no complaints, up ad lib, voiding and tolerating PO.  Having cramping, but controlled with pain medicine.  Lochia normal.  Having some difficulty breastfeeding.    Objective: Blood pressure 113/81, pulse 93, temperature 98.4 F (36.9 C), temperature source Axillary, resp. rate 18, height 5\' 5"  (1.651 m), weight 67.586 kg (149 lb), last menstrual period 11/21/2010, SpO2 97.00%, unknown if currently breastfeeding.  Physical Exam:  General: alert, cooperative and no distress Lochia: appropriate Uterine Fundus: firm DVT Evaluation: No evidence of DVT seen on physical exam.   Basename 09/11/11 0830  HGB 11.8*  HCT 34.8*    Assessment/Plan: Plan for discharge tomorrow, Breastfeeding and Lactation consult.  Continue with pain control.   LOS: 1 day   STINSON, JACOB JEHIEL 09/12/2011, 6:19 AM

## 2011-09-12 NOTE — Addendum Note (Signed)
Addendum  created 09/12/11 1025 by Quin Hoop Kiano Terrien   Modules edited:Charges VN, Notes Section
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
## 2011-09-12 NOTE — Discharge Summary (Signed)
Agree with note. 

## 2011-09-12 NOTE — Anesthesia Postprocedure Evaluation (Signed)
  Anesthesia Post-op Note  Patient: Caitlin Berger  Procedure(s) Performed: * No procedures listed *  Patient Location: Mother/Baby  Anesthesia Type: Epidural  Level of Consciousness: awake, alert  and oriented  Airway and Oxygen Therapy: Patient Spontanous Breathing  Post-op Pain: mild  Post-op Assessment: Patient's Cardiovascular Status Stable, Respiratory Function Stable, Patent Airway, No signs of Nausea or vomiting and Pain level controlled  Post-op Vital Signs: stable  Complications: No apparent anesthesia complications

## 2011-09-12 NOTE — Discharge Summary (Addendum)
Obstetric Discharge Summary Reason for Admission: onset of labor Prenatal Procedures: NST Intrapartum Procedures: spontaneous vaginal delivery Postpartum Procedures: none Complications-Operative and Postpartum: see summary below Hemoglobin  Date Value Range Status  09/11/2011 11.8* 12.0-15.0 (g/dL) Final     HCT  Date Value Range Status  09/11/2011 34.8* 36.0-46.0 (%) Final    Discharge Diagnoses: Term Pregnancy-delivered  At 3:13 PM a viable female was delivered via Vaginal, Spontaneous Delivery (Presentation: Right Occiput Anterior).  APGAR: 2, 6, 9; weight 6 pounds 15 ounces.   Placenta status: Intact, Spontaneous. Cord: 3 vessels with the following complications: Infant born with poor tone. Primary resuscitation started immediately with vigorous rubbing, nasal and oral suction with bulb. No nuchal cords present. Code APGAR called. See neonatology note.   Anesthesia: Epidural  Episiotomy: None  Lacerations: None  Est. Blood Loss (mL): 300 mL   Mom to postpartum. Baby to nursery-stable.  Pediatrician okay for baby to go home with mother today.   Discharge Information: Date: 09/12/2011 Activity: pelvic rest x 6 weeks Diet: routine Medications: PNV, Ibuprofen, Percocet and Senna-kot Condition: stable Instructions: refer to practice specific booklet Discharge to: home Follow-up Information    Follow up with [facility] Dept in 6 weeks.         Newborn Data: Live born female  Birth Weight: 6 lb 15.1 oz (3150 g) APGAR: 2, 6  Home with mother.  DE LA CRUZ,IVY 09/12/2011, 5:09 PM Agree with above Elsie Lincoln, MD

## 2011-09-14 ENCOUNTER — Encounter: Payer: Medicaid Other | Admitting: Family Medicine

## 2011-09-14 NOTE — Progress Notes (Signed)
UR chart review completed.  

## 2011-10-19 ENCOUNTER — Ambulatory Visit: Payer: Medicaid Other | Admitting: Advanced Practice Midwife

## 2012-04-01 ENCOUNTER — Encounter (HOSPITAL_COMMUNITY): Payer: Self-pay | Admitting: Emergency Medicine

## 2012-04-01 ENCOUNTER — Emergency Department (HOSPITAL_COMMUNITY)
Admission: EM | Admit: 2012-04-01 | Discharge: 2012-04-01 | Disposition: A | Discharge status: Home / Self Care | Payer: Medicaid Other | Attending: Emergency Medicine | Admitting: Emergency Medicine

## 2012-04-01 DIAGNOSIS — R5383 Other fatigue: Secondary | ICD-10-CM | POA: Insufficient documentation

## 2012-04-01 DIAGNOSIS — Z79899 Other long term (current) drug therapy: Secondary | ICD-10-CM | POA: Insufficient documentation

## 2012-04-01 DIAGNOSIS — R51 Headache: Secondary | ICD-10-CM | POA: Insufficient documentation

## 2012-04-01 DIAGNOSIS — K3189 Other diseases of stomach and duodenum: Secondary | ICD-10-CM | POA: Insufficient documentation

## 2012-04-01 DIAGNOSIS — R5381 Other malaise: Secondary | ICD-10-CM | POA: Insufficient documentation

## 2012-04-01 NOTE — Discharge Instructions (Signed)
Please read and follow all provided instructions.  Your diagnoses today include:  1. Medication withdrawal   2. Malaise and fatigue     Tests performed today include:  None  Home care instructions:  Your symptoms are likely due to withdrawal from your daily medications.   Follow any educational materials contained in this packet.  Take your medications as prescribed by your doctor.  Follow-up instructions: Please follow-up with your primary care provider in the next 3 days for further evaluation of your symptoms. If you do not have a primary care doctor -- see below for referral information.   Call your psychiatrist for an appointment as soon as possible to discuss medications.   Return instructions:   Please return to the Emergency Department if you experience worsening symptoms.   Please return if you have any other emergent concerns.  Additional Information:  Your vital signs today were: BP 107/45  Pulse 67  Temp 97.2 F (36.2 C) (Oral)  Resp 16  SpO2 100%  LMP 03/31/2012 If your blood pressure (BP) was elevated above 135/85 this visit, please have this repeated by your doctor within one month. -------------- No Primary Care Doctor Call [facility]  [phone] Other agencies that provide inexpensive medical care    [facility]  [phone]    [facility]  [phone]    [facility]  [phone]    [facility]  [phone]    [facility]  [phone]    [facility]  [phone] -------------- RESOURCE GUIDE:  Dental Problems  Patients with Medicaid: [facility] [phone] [address]                                            [id number] [employer] Phone:  [phone]                                                   Phone:  [phone]  If unable to pay or uninsured, contact:  [facility] or [facility]. to become qualified for the adult dental  clinic.  Chronic Pain Problems Contact [facility]  [phone] Patients need to be referred by their primary care doctor.  Insufficient Money for Medicine Contact [employer] Way:  call "211" or [facility] [phone]) [phone].  Psychological Services [facility]  [phone] [facility]  [phone] [facility]   [phone] (emergency services [phone])  Substance Abuse Resources [facility]  [phone] [facility] [phone] [place] [phone] Floydene Flock [phone] Residential & Outpatient Substance Abuse Program  [phone]  Abuse/Neglect [facility] Child Abuse Hotline [phone] [facility] Child Abuse Hotline [phone] (After Hours)  Emergency Shelter [facility] [phone]  Maternity Homes [facility] at [facility] [place] [facility] [place] [phone] [place] [facility] [phone]  [facility]  [facility] [address] [place]                       [address]      [address]  [place]                                                [place]                            [place] Phone:  [phone]                                   Phone:  [phone]                 Phone:  [phone]  [facility] Phone:  [phone]  [place] Child Abuse Hotline [phone] (After Hours)    

## 2012-04-01 NOTE — ED Notes (Signed)
Mom stated, she's not taken her meds in 3 days and i think she's having withdrawal symptoms.  Pt. Stated, I feel bad all over no pain, I just don't feel good.  Pt. In a wheelchair crying.

## 2012-04-01 NOTE — ED Notes (Signed)
Pt states came off of medication x 3 days ago by choice. Mother reports pt more lethargic, not eating, sleeping a lot. Pt denies SI/HI. Unsure of exact medications, but states some are for bipolar. Denies drug/alcohol/narcotic abuse. Mother reports d/c'ed from [facility] last week for similar episode.
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
## 2012-04-05 NOTE — ED Provider Notes (Signed)
Medical screening examination/treatment/procedure(s) were performed by non-physician practitioner and as supervising physician I was immediately available for consultation/collaboration.  Flint Melter, MD 04/05/12 1126

## 2012-11-08 NOTE — Progress Notes (Signed)
This encounter was created in error - please disregard.

## 2013-03-02 ENCOUNTER — Ambulatory Visit: Payer: Medicaid Other | Admitting: Diagnostic Neuroimaging

## 2013-03-15 ENCOUNTER — Telehealth: Payer: Self-pay | Admitting: Diagnostic Neuroimaging

## 2013-03-15 NOTE — Telephone Encounter (Signed)
Patient needs to be rescheduled for her 03/30/2013 appt with Dr Marjory Lies

## 2013-03-29 ENCOUNTER — Encounter: Payer: Self-pay | Admitting: Diagnostic Neuroimaging

## 2013-03-29 ENCOUNTER — Ambulatory Visit (INDEPENDENT_AMBULATORY_CARE_PROVIDER_SITE_OTHER): Payer: Medicaid Other | Admitting: Diagnostic Neuroimaging

## 2013-03-29 VITALS — BP 95/65 | HR 73 | Temp 97.3°F | Ht 65.5 in | Wt 107.0 lb

## 2013-03-29 DIAGNOSIS — R51 Headache: Secondary | ICD-10-CM

## 2013-03-29 MED ORDER — TOPIRAMATE 25 MG PO TABS: 25.0000 mg | ORAL_TABLET | Freq: Two times a day (BID) | ORAL | Status: DC

## 2013-03-29 MED ORDER — DIVALPROEX SODIUM ER 500 MG PO TB24: 500.0000 mg | ORAL_TABLET | Freq: Every day | ORAL | Status: DC

## 2013-03-29 NOTE — Patient Instructions (Addendum)
We will call in 2 different preventative medications if you decide to try one of them.  Start Topamax 25mg  daily, tablet at bedtime for 1 week then increase to 1 tablet twice a day.   Depakote 250mg   Return for follow up in 2 months.   Headaches, Frequently Asked Questions MIGRAINE HEADACHES Q: What is migraine? What causes it? How can I treat it? A: Generally, migraine headaches begin as a dull ache. Then they develop into a constant, throbbing, and pulsating pain. You may experience pain at the temples. You may experience pain at the front or back of one or both sides of the head. The pain is usually accompanied by a combination of:  Nausea.  Vomiting.  Sensitivity to light and noise. Some people (about 15%) experience an aura (see below) before an attack. The cause of migraine is believed to be chemical reactions in the brain. Treatment for migraine may include over-the-counter or prescription medications. It may also include self-help techniques. These include relaxation training and biofeedback.  Q: What is an aura? A: About 15% of people with migraine get an "aura". This is a sign of neurological symptoms that occur before a migraine headache. You may see wavy or jagged lines, dots, or flashing lights. You might experience tunnel vision or blind spots in one or both eyes. The aura can include visual or auditory hallucinations (something imagined). It may include disruptions in smell (such as strange odors), taste or touch. Other symptoms include:  Numbness.  A "pins and needles" sensation.  Difficulty in recalling or speaking the correct word. These neurological events may last as long as 60 minutes. These symptoms will fade as the headache begins. Q: What is a trigger? A: Certain physical or environmental factors can lead to or "trigger" a migraine. These include:  Foods.  Hormonal changes.  Weather.  Stress. It is important to remember that triggers are different for  everyone. To help prevent migraine attacks, you need to figure out which triggers affect you. Keep a headache diary. This is a good way to track triggers. The diary will help you talk to your healthcare professional about your condition. Q: Does weather affect migraines? A: Bright sunshine, hot, humid conditions, and drastic changes in barometric pressure may lead to, or "trigger," a migraine attack in some people. But studies have shown that weather does not act as a trigger for everyone with migraines. Q: What is the link between migraine and hormones? A: Hormones start and regulate many of your body's functions. Hormones keep your body in balance within a constantly changing environment. The levels of hormones in your body are unbalanced at times. Examples are during menstruation, pregnancy, or menopause. That can lead to a migraine attack. In fact, about three quarters of all women with migraine report that their attacks are related to the menstrual cycle.  Q: Is there an increased risk of stroke for migraine sufferers? A: The likelihood of a migraine attack causing a stroke is very remote. That is not to say that migraine sufferers cannot have a stroke associated with their migraines. In persons under age 72, the most common associated factor for stroke is migraine headache. But over the course of a person's normal life span, the occurrence of migraine headache may actually be associated with a reduced risk of dying from cerebrovascular disease due to stroke.  Q: What are acute medications for migraine? A: Acute medications are used to treat the pain of the headache after it has started. Examples  over-the-counter medications, NSAIDs, ergots, and triptans.  Q: What are the triptans? A: Triptans are the newest class of abortive medications. They are specifically targeted to treat migraine. Triptans are vasoconstrictors. They moderate some chemical reactions in the brain. The triptans work on receptors  in your brain. Triptans help to restore the balance of a neurotransmitter called serotonin. Fluctuations in levels of serotonin are thought to be a main cause of migraine.  Q: Are over-the-counter medications for migraine effective? A: Over-the-counter, or "OTC," medications may be effective in relieving mild to moderate pain and associated symptoms of migraine. But you should see your caregiver before beginning any treatment regimen for migraine.  Q: What are preventive medications for migraine? A: Preventive medications for migraine are sometimes referred to as "prophylactic" treatments. They are used to reduce the frequency, severity, and length of migraine attacks. Examples of preventive medications include antiepileptic medications, antidepressants, beta-blockers, calcium channel blockers, and NSAIDs (nonsteroidal anti-inflammatory drugs). Q: Why are anticonvulsants used to treat migraine? A: During the past few years, there has been an increased interest in antiepileptic drugs for the prevention of migraine. They are sometimes referred to as "anticonvulsants". Both epilepsy and migraine may be caused by similar reactions in the brain.  Q: Why are antidepressants used to treat migraine? A: Antidepressants are typically used to treat people with depression. They may reduce migraine frequency by regulating chemical levels, such as serotonin, in the brain.  Q: What alternative therapies are used to treat migraine? A: The term "alternative therapies" is often used to describe treatments considered outside the scope of conventional Western medicine. Examples of alternative therapy include acupuncture, acupressure, and yoga. Another common alternative treatment is herbal therapy. Some herbs are believed to relieve headache pain. Always discuss alternative therapies with your caregiver before proceeding. Some herbal products contain arsenic and other toxins. TENSION HEADACHES Q: What is a tension-type  headache? What causes it? How can I treat it? A: Tension-type headaches occur randomly. They are often the result of temporary stress, anxiety, fatigue, or anger. Symptoms include soreness in your temples, a tightening band-like sensation around your head (a "vice-like" ache). Symptoms can also include a pulling feeling, pressure sensations, and contracting head and neck muscles. The headache begins in your forehead, temples, or the back of your head and neck. Treatment for tension-type headache may include over-the-counter or prescription medications. Treatment may also include self-help techniques such as relaxation training and biofeedback. CLUSTER HEADACHES Q: What is a cluster headache? What causes it? How can I treat it? A: Cluster headache gets its name because the attacks come in groups. The pain arrives with little, if any, warning. It is usually on one side of the head. A tearing or bloodshot eye and a runny nose on the same side of the headache may also accompany the pain. Cluster headaches are believed to be caused by chemical reactions in the brain. They have been described as the most severe and intense of any headache type. Treatment for cluster headache includes prescription medication and oxygen. SINUS HEADACHES Q: What is a sinus headache? What causes it? How can I treat it? A: When a cavity in the bones of the face and skull (a sinus) becomes inflamed, the inflammation will cause localized pain. This condition is usually the result of an allergic reaction, a tumor, or an infection. If your headache is caused by a sinus blockage, such as an infection, you will probably have a fever. An x-ray will confirm a sinus blockage. Your caregiver's  treatment might include antibiotics for the infection, as well as antihistamines or decongestants.  REBOUND HEADACHES Q: What is a rebound headache? What causes it? How can I treat it? A: A pattern of taking acute headache medications too often can lead  to a condition known as "rebound headache." A pattern of taking too much headache medication includes taking it more than 2 days per week or in excessive amounts. That means more than the label or a caregiver advises. With rebound headaches, your medications not only stop relieving pain, they actually begin to cause headaches. Doctors treat rebound headache by tapering the medication that is being overused. Sometimes your caregiver will gradually substitute a different type of treatment or medication. Stopping may be a challenge. Regularly overusing a medication increases the potential for serious side effects. Consult a caregiver if you regularly use headache medications more than 2 days per week or more than the label advises. ADDITIONAL QUESTIONS AND ANSWERS Q: What is biofeedback? A: Biofeedback is a self-help treatment. Biofeedback uses special equipment to monitor your body's involuntary physical responses. Biofeedback monitors:  Breathing.  Pulse.  Heart rate.  Temperature.  Muscle tension.  Brain activity. Biofeedback helps you refine and perfect your relaxation exercises. You learn to control the physical responses that are related to stress. Once the technique has been mastered, you do not need the equipment any more. Q: Are headaches hereditary? A: Four out of five (80%) of people that suffer report a family history of migraine. Scientists are not sure if this is genetic or a family predisposition. Despite the uncertainty, a child has a 50% chance of having migraine if one parent suffers. The child has a 75% chance if both parents suffer.  Q: Can children get headaches? A: By the time they reach high school, most young people have experienced some type of headache. Many safe and effective approaches or medications can prevent a headache from occurring or stop it after it has begun.  Q: What type of doctor should I see to diagnose and treat my headache? A: Start with your primary  caregiver. Discuss his or her experience and approach to headaches. Discuss methods of classification, diagnosis, and treatment. Your caregiver may decide to recommend you to a headache specialist, depending upon your symptoms or other physical conditions. Having diabetes, allergies, etc., may require a more comprehensive and inclusive approach to your headache. The National Headache Foundation will provide, upon request, a list of [facility] physician members in your state. Document Released: 12/12/2003 Document Revised: 12/14/2011 Document Reviewed: 05/21/2008 [facility] Patient Information 2014 [place], [place].
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
## 2013-03-30 ENCOUNTER — Ambulatory Visit: Payer: Medicaid Other | Admitting: Diagnostic Neuroimaging

## 2013-03-30 DIAGNOSIS — R519 Headache, unspecified: Secondary | ICD-10-CM | POA: Insufficient documentation

## 2013-04-17 ENCOUNTER — Telehealth: Payer: Self-pay | Admitting: Diagnostic Neuroimaging

## 2013-04-17 MED ORDER — ELETRIPTAN HYDROBROMIDE 40 MG PO TABS: 40.0000 mg | ORAL_TABLET | ORAL | Status: DC | PRN

## 2013-04-17 NOTE — Telephone Encounter (Signed)
Rx has been sent. Per OV notes, samples were given.

## 2013-04-17 NOTE — Telephone Encounter (Signed)
Pt called is wanting to see if she can get a prescription on Relpax she got samples when she was in for her apt but she is out and she is having headaches and wants to see if she can get this called in as soon as possible. Thanks

## 2013-06-02 ENCOUNTER — Ambulatory Visit: Payer: Medicaid Other | Admitting: Diagnostic Neuroimaging

## 2013-06-16 ENCOUNTER — Emergency Department (HOSPITAL_COMMUNITY)
Admission: EM | Admit: 2013-06-16 | Discharge: 2013-06-16 | Disposition: A | Discharge status: Home / Self Care | Payer: Medicaid Other | Attending: Emergency Medicine | Admitting: Emergency Medicine

## 2013-06-16 DIAGNOSIS — F411 Generalized anxiety disorder: Secondary | ICD-10-CM | POA: Insufficient documentation

## 2013-06-16 DIAGNOSIS — K029 Dental caries, unspecified: Secondary | ICD-10-CM

## 2013-06-16 DIAGNOSIS — Z8742 Personal history of other diseases of the female genital tract: Secondary | ICD-10-CM | POA: Insufficient documentation

## 2013-06-16 DIAGNOSIS — F3289 Other specified depressive episodes: Secondary | ICD-10-CM | POA: Insufficient documentation

## 2013-06-16 DIAGNOSIS — Z79899 Other long term (current) drug therapy: Secondary | ICD-10-CM | POA: Insufficient documentation

## 2013-06-16 DIAGNOSIS — J029 Acute pharyngitis, unspecified: Secondary | ICD-10-CM | POA: Insufficient documentation

## 2013-06-16 DIAGNOSIS — G43909 Migraine, unspecified, not intractable, without status migrainosus: Secondary | ICD-10-CM | POA: Insufficient documentation

## 2013-06-16 DIAGNOSIS — K089 Disorder of teeth and supporting structures, unspecified: Secondary | ICD-10-CM | POA: Insufficient documentation

## 2013-06-16 DIAGNOSIS — F329 Major depressive disorder, single episode, unspecified: Secondary | ICD-10-CM | POA: Insufficient documentation

## 2013-06-16 DIAGNOSIS — Z87891 Personal history of nicotine dependence: Secondary | ICD-10-CM | POA: Insufficient documentation

## 2013-06-16 DIAGNOSIS — J45909 Unspecified asthma, uncomplicated: Secondary | ICD-10-CM | POA: Insufficient documentation

## 2013-06-16 DIAGNOSIS — F909 Attention-deficit hyperactivity disorder, unspecified type: Secondary | ICD-10-CM | POA: Insufficient documentation

## 2013-06-16 DIAGNOSIS — R509 Fever, unspecified: Secondary | ICD-10-CM | POA: Insufficient documentation

## 2013-06-16 MED ORDER — IBUPROFEN 800 MG PO TABS
800.0000 mg | ORAL_TABLET | Freq: Once | ORAL | Status: AC
  Administered 2013-06-16: 800 mg via ORAL
  Filled 2013-06-16: qty 1

## 2013-06-16 MED ORDER — PENICILLIN V POTASSIUM 500 MG PO TABS: 500.0000 mg | ORAL_TABLET | Freq: Four times a day (QID) | ORAL | Status: DC

## 2013-06-16 MED ORDER — OXYCODONE-ACETAMINOPHEN 5-325 MG PO TABS
2.0000 | ORAL_TABLET | Freq: Once | ORAL | Status: AC
  Administered 2013-06-16: 2 via ORAL
  Filled 2013-06-16: qty 2

## 2013-06-16 MED ORDER — OXYCODONE-ACETAMINOPHEN 5-325 MG PO TABS: ORAL_TABLET | ORAL | Status: DC

## 2013-06-16 NOTE — ED Notes (Signed)
Pt reports left lower dental pain, wisdom tooth is coming in. Pain x4 days, 9/10 pain.
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
## 2013-06-21 NOTE — ED Provider Notes (Signed)
Medical screening examination/treatment/procedure(s) were performed by non-physician practitioner and as supervising physician I was immediately available for consultation/collaboration.   Claudean Kinds, MD 06/21/13 [phone]

## 2013-06-24 ENCOUNTER — Emergency Department (HOSPITAL_COMMUNITY)
Admission: EM | Admit: 2013-06-24 | Discharge: 2013-06-24 | Disposition: A | Discharge status: Home / Self Care | Payer: Medicaid Other | Attending: Emergency Medicine | Admitting: Emergency Medicine

## 2013-06-24 ENCOUNTER — Encounter (HOSPITAL_COMMUNITY): Payer: Self-pay | Admitting: Emergency Medicine

## 2013-06-24 DIAGNOSIS — H9209 Otalgia, unspecified ear: Secondary | ICD-10-CM | POA: Insufficient documentation

## 2013-06-24 DIAGNOSIS — K0889 Other specified disorders of teeth and supporting structures: Secondary | ICD-10-CM

## 2013-06-24 DIAGNOSIS — R1310 Dysphagia, unspecified: Secondary | ICD-10-CM | POA: Insufficient documentation

## 2013-06-24 DIAGNOSIS — Z79899 Other long term (current) drug therapy: Secondary | ICD-10-CM | POA: Insufficient documentation

## 2013-06-24 DIAGNOSIS — G43909 Migraine, unspecified, not intractable, without status migrainosus: Secondary | ICD-10-CM | POA: Insufficient documentation

## 2013-06-24 DIAGNOSIS — F329 Major depressive disorder, single episode, unspecified: Secondary | ICD-10-CM | POA: Insufficient documentation

## 2013-06-24 DIAGNOSIS — Z87891 Personal history of nicotine dependence: Secondary | ICD-10-CM | POA: Insufficient documentation

## 2013-06-24 DIAGNOSIS — F3289 Other specified depressive episodes: Secondary | ICD-10-CM | POA: Insufficient documentation

## 2013-06-24 DIAGNOSIS — F411 Generalized anxiety disorder: Secondary | ICD-10-CM | POA: Insufficient documentation

## 2013-06-24 DIAGNOSIS — Z8742 Personal history of other diseases of the female genital tract: Secondary | ICD-10-CM | POA: Insufficient documentation

## 2013-06-24 DIAGNOSIS — Z87828 Personal history of other (healed) physical injury and trauma: Secondary | ICD-10-CM | POA: Insufficient documentation

## 2013-06-24 DIAGNOSIS — J45909 Unspecified asthma, uncomplicated: Secondary | ICD-10-CM | POA: Insufficient documentation

## 2013-06-24 DIAGNOSIS — K089 Disorder of teeth and supporting structures, unspecified: Secondary | ICD-10-CM | POA: Insufficient documentation

## 2013-06-24 MED ORDER — FLUCONAZOLE 200 MG PO TABS: 200.0000 mg | ORAL_TABLET | Freq: Every day | ORAL | Status: AC

## 2013-06-24 MED ORDER — PENICILLIN V POTASSIUM 500 MG PO TABS: 500.0000 mg | ORAL_TABLET | Freq: Three times a day (TID) | ORAL | Status: DC

## 2013-06-24 MED ORDER — IBUPROFEN 800 MG PO TABS
800.0000 mg | ORAL_TABLET | Freq: Once | ORAL | Status: AC
  Administered 2013-06-24: 800 mg via ORAL
  Filled 2013-06-24: qty 1

## 2013-06-24 MED ORDER — OXYCODONE-ACETAMINOPHEN 5-325 MG PO TABS: 1.0000 | ORAL_TABLET | Freq: Four times a day (QID) | ORAL | Status: DC | PRN
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
## 2013-06-24 NOTE — ED Notes (Signed)
Pt c/o of left side wisdom tooth pain 10/10. Dentist appt on Oct 15th. Given antibiotics and pain medication no relief.

## 2013-06-24 NOTE — ED Provider Notes (Signed)
Medical screening examination/treatment/procedure(s) were performed by non-physician practitioner and as supervising physician I was immediately available for consultation/collaboration.    Windell Musson J. Kytzia Gienger, MD 06/24/13 2220 

## 2013-07-06 HISTORY — PX: WISDOM TOOTH EXTRACTION: SHX21

## 2013-07-10 ENCOUNTER — Encounter (HOSPITAL_COMMUNITY): Payer: Self-pay | Admitting: Emergency Medicine

## 2013-07-10 ENCOUNTER — Emergency Department (HOSPITAL_COMMUNITY)
Admission: EM | Admit: 2013-07-10 | Discharge: 2013-07-10 | Disposition: A | Discharge status: Home / Self Care | Payer: Medicaid Other | Attending: Emergency Medicine | Admitting: Emergency Medicine

## 2013-07-10 DIAGNOSIS — Z48815 Encounter for surgical aftercare following surgery on the digestive system: Secondary | ICD-10-CM | POA: Insufficient documentation

## 2013-07-10 DIAGNOSIS — F329 Major depressive disorder, single episode, unspecified: Secondary | ICD-10-CM | POA: Insufficient documentation

## 2013-07-10 DIAGNOSIS — K089 Disorder of teeth and supporting structures, unspecified: Secondary | ICD-10-CM | POA: Insufficient documentation

## 2013-07-10 DIAGNOSIS — F3289 Other specified depressive episodes: Secondary | ICD-10-CM | POA: Insufficient documentation

## 2013-07-10 DIAGNOSIS — T85848D Pain due to other internal prosthetic devices, implants and grafts, subsequent encounter: Secondary | ICD-10-CM

## 2013-07-10 DIAGNOSIS — F909 Attention-deficit hyperactivity disorder, unspecified type: Secondary | ICD-10-CM | POA: Insufficient documentation

## 2013-07-10 DIAGNOSIS — J45909 Unspecified asthma, uncomplicated: Secondary | ICD-10-CM | POA: Insufficient documentation

## 2013-07-10 DIAGNOSIS — F411 Generalized anxiety disorder: Secondary | ICD-10-CM | POA: Insufficient documentation

## 2013-07-10 DIAGNOSIS — Z87891 Personal history of nicotine dependence: Secondary | ICD-10-CM | POA: Insufficient documentation

## 2013-07-10 DIAGNOSIS — Z79899 Other long term (current) drug therapy: Secondary | ICD-10-CM | POA: Insufficient documentation

## 2013-07-10 MED ORDER — OXYCODONE-ACETAMINOPHEN 5-325 MG PO TABS: 1.0000 | ORAL_TABLET | Freq: Four times a day (QID) | ORAL | Status: DC | PRN

## 2013-07-10 MED ORDER — OXYCODONE-ACETAMINOPHEN 5-325 MG PO TABS
2.0000 | ORAL_TABLET | Freq: Once | ORAL | Status: AC
  Administered 2013-07-10: 2 via ORAL
  Filled 2013-07-10: qty 2

## 2013-07-10 NOTE — ED Notes (Addendum)
Pt presents today with dental pain to each posterior dental arch following wisdom teeth extraction on 07/08/2013. Pt reports it is painful to open her mouth. Pt states she was prescribed Vicodin from the oral surgeon, however pt states "I flushed them down the toilet" because I broke out in hives. Pt is concerned regarding infection due to not getting an antibiotic after surgery, however was prescribed a per-surgical antibiotic here. Pt is A/O x4 and in NAD.
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
## 2013-07-11 NOTE — ED Provider Notes (Signed)
Medical screening examination/treatment/procedure(s) were performed by non-physician practitioner and as supervising physician I was immediately available for consultation/collaboration.  Derwood Kaplan, MD 07/11/13 0981

## 2014-08-06 ENCOUNTER — Encounter (HOSPITAL_COMMUNITY): Payer: Self-pay | Admitting: Emergency Medicine

## 2014-09-03 ENCOUNTER — Inpatient Hospital Stay (HOSPITAL_COMMUNITY)
Admission: AD | Admit: 2014-09-03 | Discharge: 2014-09-03 | Disposition: A | Discharge status: Home / Self Care | Payer: Medicaid Other | Source: Ambulatory Visit | Attending: Obstetrics & Gynecology | Admitting: Obstetrics & Gynecology

## 2014-09-03 ENCOUNTER — Encounter (HOSPITAL_COMMUNITY): Payer: Self-pay

## 2014-09-03 DIAGNOSIS — O2342 Unspecified infection of urinary tract in pregnancy, second trimester: Secondary | ICD-10-CM | POA: Insufficient documentation

## 2014-09-03 DIAGNOSIS — Z87891 Personal history of nicotine dependence: Secondary | ICD-10-CM | POA: Diagnosis not present

## 2014-09-03 DIAGNOSIS — N3001 Acute cystitis with hematuria: Secondary | ICD-10-CM

## 2014-09-03 DIAGNOSIS — R109 Unspecified abdominal pain: Secondary | ICD-10-CM | POA: Diagnosis present

## 2014-09-03 DIAGNOSIS — Z3A20 20 weeks gestation of pregnancy: Secondary | ICD-10-CM | POA: Insufficient documentation

## 2014-09-03 LAB — CBC
HCT: 34.2 % — ABNORMAL LOW (ref 36.0–46.0)
Hemoglobin: 11.8 g/dL — ABNORMAL LOW (ref 12.0–15.0)
MCH: 32.7 pg (ref 26.0–34.0)
MCHC: 34.5 g/dL (ref 30.0–36.0)
MCV: 94.7 fL (ref 78.0–100.0)
Platelets: 335 10*3/uL (ref 150–400)
RBC: 3.61 MIL/uL — ABNORMAL LOW (ref 3.87–5.11)
RDW: 13.6 % (ref 11.5–15.5)
WBC: 16.6 10*3/uL — AB (ref 4.0–10.5)

## 2014-09-03 LAB — URINALYSIS, ROUTINE W REFLEX MICROSCOPIC
Bilirubin Urine: NEGATIVE
GLUCOSE, UA: NEGATIVE mg/dL
Ketones, ur: NEGATIVE mg/dL
Nitrite: NEGATIVE
PROTEIN: 30 mg/dL — AB
SPECIFIC GRAVITY, URINE: 1.02 (ref 1.005–1.030)
UROBILINOGEN UA: 0.2 mg/dL (ref 0.0–1.0)
pH: 8.5 — ABNORMAL HIGH (ref 5.0–8.0)

## 2014-09-03 LAB — URINE MICROSCOPIC-ADD ON

## 2014-09-03 LAB — POCT PREGNANCY, URINE: PREG TEST UR: POSITIVE — AB

## 2014-09-03 MED ORDER — SULFAMETHOXAZOLE-TRIMETHOPRIM 800-160 MG PO TABS
1.0000 | ORAL_TABLET | Freq: Once | ORAL | Status: AC
  Administered 2014-09-03: 1 via ORAL
  Filled 2014-09-03: qty 1

## 2014-09-03 MED ORDER — NITROFURANTOIN MONOHYD MACRO 100 MG PO CAPS: 100.0000 mg | ORAL_CAPSULE | Freq: Two times a day (BID) | ORAL | Status: DC

## 2014-09-03 MED ORDER — PHENAZOPYRIDINE HCL 200 MG PO TABS: 200.0000 mg | ORAL_TABLET | Freq: Three times a day (TID) | ORAL | Status: DC

## 2014-09-03 MED ORDER — OXYCODONE-ACETAMINOPHEN 5-325 MG PO TABS
1.0000 | ORAL_TABLET | Freq: Once | ORAL | Status: AC
  Administered 2014-09-03: 1 via ORAL
  Filled 2014-09-03: qty 1

## 2014-09-03 MED ORDER — PRENATAL COMPLETE 14-0.4 MG PO TABS: 1.0000 | ORAL_TABLET | Freq: Every day | ORAL | Status: DC

## 2014-09-03 MED ORDER — SULFAMETHOXAZOLE-TRIMETHOPRIM 800-160 MG PO TABS: 1.0000 | ORAL_TABLET | Freq: Two times a day (BID) | ORAL | Status: AC

## 2014-09-03 MED ORDER — FLUCONAZOLE 150 MG PO TABS: 150.0000 mg | ORAL_TABLET | Freq: Every day | ORAL | Status: DC

## 2014-09-03 NOTE — MAU Note (Signed)
Patient states she has had a positive home pregnancy test. Has been having abdominal and back pain and pain and frequency with urination for a couple of weeks. Nausea off and on. Denies bleeding but has a lot of vaginal discharge.

## 2014-09-03 NOTE — Discharge Instructions (Signed)
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
## 2014-09-05 LAB — CULTURE, OB URINE: Colony Count: >=100000

## 2014-09-06 ENCOUNTER — Ambulatory Visit (HOSPITAL_COMMUNITY)
Admission: RE | Admit: 2014-09-06 | Discharge: 2014-09-06 | Disposition: A | Discharge status: Home / Self Care | Payer: Medicaid Other | Source: Ambulatory Visit | Attending: Medical | Admitting: Medical

## 2014-09-06 ENCOUNTER — Other Ambulatory Visit (HOSPITAL_COMMUNITY): Payer: Self-pay | Admitting: Medical

## 2014-09-06 DIAGNOSIS — Z36 Encounter for antenatal screening of mother: Secondary | ICD-10-CM | POA: Insufficient documentation

## 2014-09-06 DIAGNOSIS — F1310 Sedative, hypnotic or anxiolytic abuse, uncomplicated: Secondary | ICD-10-CM | POA: Diagnosis not present

## 2014-09-06 DIAGNOSIS — O99322 Drug use complicating pregnancy, second trimester: Secondary | ICD-10-CM | POA: Insufficient documentation

## 2014-09-06 DIAGNOSIS — O0932 Supervision of pregnancy with insufficient antenatal care, second trimester: Secondary | ICD-10-CM | POA: Diagnosis not present

## 2014-09-06 DIAGNOSIS — N3001 Acute cystitis with hematuria: Secondary | ICD-10-CM

## 2014-09-06 DIAGNOSIS — Z3A21 21 weeks gestation of pregnancy: Secondary | ICD-10-CM | POA: Insufficient documentation

## 2014-09-06 DIAGNOSIS — F1910 Other psychoactive substance abuse, uncomplicated: Secondary | ICD-10-CM | POA: Insufficient documentation

## 2014-09-13 ENCOUNTER — Encounter: Payer: Medicaid Other | Admitting: Advanced Practice Midwife

## 2014-09-24 ENCOUNTER — Encounter: Payer: Self-pay | Admitting: Obstetrics & Gynecology

## 2014-09-24 ENCOUNTER — Encounter: Payer: Medicaid Other | Admitting: Obstetrics & Gynecology

## 2014-10-04 ENCOUNTER — Telehealth: Payer: Self-pay | Admitting: Family Medicine

## 2014-10-04 ENCOUNTER — Encounter: Payer: Medicaid Other | Admitting: Family Medicine

## 2014-10-04 ENCOUNTER — Encounter: Payer: Self-pay | Admitting: Family Medicine

## 2014-10-04 NOTE — Telephone Encounter (Signed)
Called Left message for patient to return call. Patient missed New OB appointment, will be sending out certified letter regarding missed appointment.

## 2014-10-05 NOTE — L&D Delivery Note (Signed)
Delivery Note At 1420 a viable female was delivered via NSVD (Presentation: occiput posterior ; meconium stained fluid ). APGAR: 7 , 9 ; weight 2830g .  Placenta status: intact. Cord: 3 vessel with the following complications: loose nuchal x1.   Anesthesia: none Episiotomy: none Lacerations: small 1st degree labial Suture Repair: none Est. Blood Loss (mL): 100cc   Mom to postpartum. Baby to Couplet care / Skin to Skin.  Delynn FlavinGottschalk, Ashly M, DO 01/07/2015, 3:31 PM  Patient is a 6112w6d at 2812w6d who was admitted for spontaneous labor at term.  She progressed without augmentation.  I was gloved and present for delivery in its entirety.  Second stage of labor progressed.  Recurrent late decels during second stage noted.  Complications: none  Lacerations: 1st degree, hemastatic  EBL: 100  William DaltonMcEachern, Shatika Grinnell, MD 5:53 PM

## 2014-10-16 ENCOUNTER — Encounter: Payer: Self-pay | Admitting: General Practice

## 2014-10-20 ENCOUNTER — Encounter (HOSPITAL_COMMUNITY): Payer: Self-pay | Admitting: *Deleted

## 2014-10-20 ENCOUNTER — Inpatient Hospital Stay (HOSPITAL_COMMUNITY)
Admission: AD | Admit: 2014-10-20 | Discharge: 2014-10-20 | Discharge status: Against Medical Advice | Payer: Medicaid Other | Source: Ambulatory Visit | Attending: Obstetrics & Gynecology | Admitting: Obstetrics & Gynecology

## 2014-10-20 DIAGNOSIS — Z3A26 26 weeks gestation of pregnancy: Secondary | ICD-10-CM | POA: Diagnosis not present

## 2014-10-20 DIAGNOSIS — Z87891 Personal history of nicotine dependence: Secondary | ICD-10-CM | POA: Diagnosis not present

## 2014-10-20 DIAGNOSIS — Y92009 Unspecified place in unspecified non-institutional (private) residence as the place of occurrence of the external cause: Secondary | ICD-10-CM | POA: Insufficient documentation

## 2014-10-20 DIAGNOSIS — W010XXA Fall on same level from slipping, tripping and stumbling without subsequent striking against object, initial encounter: Secondary | ICD-10-CM | POA: Insufficient documentation

## 2014-10-20 DIAGNOSIS — O368120 Decreased fetal movements, second trimester, not applicable or unspecified: Secondary | ICD-10-CM | POA: Diagnosis not present

## 2014-10-20 DIAGNOSIS — O0932 Supervision of pregnancy with insufficient antenatal care, second trimester: Secondary | ICD-10-CM | POA: Diagnosis not present

## 2014-10-20 DIAGNOSIS — W19XXXA Unspecified fall, initial encounter: Secondary | ICD-10-CM

## 2014-10-20 DIAGNOSIS — S3992XA Unspecified injury of lower back, initial encounter: Secondary | ICD-10-CM

## 2014-10-20 DIAGNOSIS — M545 Low back pain: Secondary | ICD-10-CM | POA: Diagnosis not present

## 2014-10-20 DIAGNOSIS — O9A212 Injury, poisoning and certain other consequences of external causes complicating pregnancy, second trimester: Secondary | ICD-10-CM

## 2014-10-20 LAB — RAPID URINE DRUG SCREEN, HOSP PERFORMED
Amphetamines: POSITIVE — AB
BENZODIAZEPINES: POSITIVE — AB
Barbiturates: NOT DETECTED
Cocaine: NOT DETECTED
Opiates: POSITIVE — AB
Tetrahydrocannabinol: POSITIVE — AB

## 2014-10-20 LAB — WET PREP, GENITAL
Trich, Wet Prep: NONE SEEN
Yeast Wet Prep HPF POC: NONE SEEN

## 2014-10-20 LAB — URINALYSIS, ROUTINE W REFLEX MICROSCOPIC
Bilirubin Urine: NEGATIVE
Glucose, UA: NEGATIVE mg/dL
HGB URINE DIPSTICK: NEGATIVE
Ketones, ur: NEGATIVE mg/dL
LEUKOCYTES UA: NEGATIVE
Nitrite: NEGATIVE
PROTEIN: NEGATIVE mg/dL
Urobilinogen, UA: 0.2 mg/dL (ref 0.0–1.0)
pH: 6 (ref 5.0–8.0)
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
## 2014-10-20 NOTE — MAU Note (Signed)
Pt took self off monitor stating that she had no-one to take care of her children at home, explained to pt the risk and benefits of leaving by Selena BattenKim booker CNM, pt verbalizes understanding of what was being explained to her.

## 2014-10-20 NOTE — MAU Note (Signed)
Pt presents to MAU with complaints of pain in her back and states that she fell over a toy and landed on her right side last night. Reports she was treated for a UTI around Thanksgiving and the medication she was given gave her a rash and she was not able to finish the medication and is now having vaginal spotting

## 2014-10-22 LAB — GC/CHLAMYDIA PROBE AMP (~~LOC~~) NOT AT ARMC
Chlamydia: NEGATIVE
NEISSERIA GONORRHEA: NEGATIVE

## 2014-11-09 ENCOUNTER — Other Ambulatory Visit (HOSPITAL_COMMUNITY)
Admission: RE | Admit: 2014-11-09 | Discharge: 2014-11-09 | Disposition: A | Discharge status: Home / Self Care | Payer: Medicaid Other | Source: Ambulatory Visit | Attending: Family | Admitting: Family

## 2014-11-09 ENCOUNTER — Encounter: Payer: Self-pay | Admitting: Family

## 2014-11-09 ENCOUNTER — Ambulatory Visit (INDEPENDENT_AMBULATORY_CARE_PROVIDER_SITE_OTHER): Payer: Medicaid Other | Admitting: Family

## 2014-11-09 VITALS — BP 104/54 | HR 84 | Temp 97.6°F | Wt 129.1 lb

## 2014-11-09 DIAGNOSIS — F319 Bipolar disorder, unspecified: Secondary | ICD-10-CM

## 2014-11-09 DIAGNOSIS — O0993 Supervision of high risk pregnancy, unspecified, third trimester: Secondary | ICD-10-CM

## 2014-11-09 DIAGNOSIS — O99322 Drug use complicating pregnancy, second trimester: Secondary | ICD-10-CM

## 2014-11-09 DIAGNOSIS — Z113 Encounter for screening for infections with a predominantly sexual mode of transmission: Secondary | ICD-10-CM | POA: Diagnosis present

## 2014-11-09 DIAGNOSIS — Z01419 Encounter for gynecological examination (general) (routine) without abnormal findings: Secondary | ICD-10-CM | POA: Insufficient documentation

## 2014-11-09 DIAGNOSIS — O0930 Supervision of pregnancy with insufficient antenatal care, unspecified trimester: Secondary | ICD-10-CM

## 2014-11-09 LAB — GLUCOSE TOLERANCE, 1 HOUR (50G) W/O FASTING: GLUCOSE 1 HOUR GTT: 83 mg/dL (ref 70–140)

## 2014-11-09 LAB — POCT URINALYSIS DIP (DEVICE)
Bilirubin Urine: NEGATIVE
Glucose, UA: NEGATIVE mg/dL
Hgb urine dipstick: NEGATIVE
Ketones, ur: NEGATIVE mg/dL
Leukocytes, UA: NEGATIVE
Nitrite: NEGATIVE
Protein, ur: NEGATIVE mg/dL
Specific Gravity, Urine: 1.02 (ref 1.005–1.030)
Urobilinogen, UA: 0.2 mg/dL (ref 0.0–1.0)
pH: 7 (ref 5.0–8.0)
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
## 2014-11-09 NOTE — Progress Notes (Signed)
Initial labs and 1hr gtt today.  C/o of pelvic pressure and lower back pain.  Reports occasional edema in feet.  New OB packet given.

## 2014-11-10 LAB — CULTURE, OB URINE
COLONY COUNT: NO GROWTH
Organism ID, Bacteria: NO GROWTH

## 2014-11-12 LAB — PRENATAL PROFILE (SOLSTAS)
ANTIBODY SCREEN: NEGATIVE
Basophils Absolute: 0 10*3/uL (ref 0.0–0.1)
Basophils Relative: 0 % (ref 0–1)
EOS ABS: 0.1 10*3/uL (ref 0.0–0.7)
Eosinophils Relative: 1 % (ref 0–5)
HCT: 33.9 % — ABNORMAL LOW (ref 36.0–46.0)
HEMOGLOBIN: 11.2 g/dL — AB (ref 12.0–15.0)
HEP B S AG: NEGATIVE
HIV 1&2 Ab, 4th Generation: NONREACTIVE
LYMPHS ABS: 3.7 10*3/uL (ref 0.7–4.0)
LYMPHS PCT: 28 % (ref 12–46)
MCH: 30.9 pg (ref 26.0–34.0)
MCHC: 33 g/dL (ref 30.0–36.0)
MCV: 93.6 fL (ref 78.0–100.0)
MPV: 9.1 fL (ref 8.6–12.4)
Monocytes Absolute: 1.1 10*3/uL — ABNORMAL HIGH (ref 0.1–1.0)
Monocytes Relative: 8 % (ref 3–12)
NEUTROS ABS: 8.3 10*3/uL — AB (ref 1.7–7.7)
NEUTROS PCT: 63 % (ref 43–77)
Platelets: 409 10*3/uL — ABNORMAL HIGH (ref 150–400)
RBC: 3.62 MIL/uL — ABNORMAL LOW (ref 3.87–5.11)
RDW: 13.1 % (ref 11.5–15.5)
RH TYPE: POSITIVE
Rubella: 2.3 Index — ABNORMAL HIGH (ref <0.90)
WBC: 13.2 10*3/uL — ABNORMAL HIGH (ref 4.0–10.5)

## 2014-11-13 ENCOUNTER — Encounter: Payer: Self-pay | Admitting: Family

## 2014-11-13 ENCOUNTER — Ambulatory Visit (INDEPENDENT_AMBULATORY_CARE_PROVIDER_SITE_OTHER): Payer: Medicaid Other | Admitting: Obstetrics and Gynecology

## 2014-11-13 ENCOUNTER — Ambulatory Visit (HOSPITAL_COMMUNITY)
Admission: RE | Admit: 2014-11-13 | Discharge: 2014-11-13 | Disposition: A | Discharge status: Home / Self Care | Payer: Medicaid Other | Source: Ambulatory Visit | Attending: Family | Admitting: Family

## 2014-11-13 ENCOUNTER — Encounter: Payer: Self-pay | Admitting: Obstetrics and Gynecology

## 2014-11-13 VITALS — BP 100/47 | HR 75 | Wt 131.5 lb

## 2014-11-13 DIAGNOSIS — O0993 Supervision of high risk pregnancy, unspecified, third trimester: Secondary | ICD-10-CM | POA: Diagnosis present

## 2014-11-13 DIAGNOSIS — O99322 Drug use complicating pregnancy, second trimester: Secondary | ICD-10-CM

## 2014-11-13 DIAGNOSIS — O43199 Other malformation of placenta, unspecified trimester: Secondary | ICD-10-CM | POA: Insufficient documentation

## 2014-11-13 DIAGNOSIS — G44329 Chronic post-traumatic headache, not intractable: Secondary | ICD-10-CM

## 2014-11-13 DIAGNOSIS — F1310 Sedative, hypnotic or anxiolytic abuse, uncomplicated: Secondary | ICD-10-CM

## 2014-11-13 DIAGNOSIS — Z364 Encounter for antenatal screening for fetal growth retardation: Secondary | ICD-10-CM | POA: Insufficient documentation

## 2014-11-13 DIAGNOSIS — Z23 Encounter for immunization: Secondary | ICD-10-CM

## 2014-11-13 DIAGNOSIS — Z3A31 31 weeks gestation of pregnancy: Secondary | ICD-10-CM | POA: Insufficient documentation

## 2014-11-13 DIAGNOSIS — F1910 Other psychoactive substance abuse, uncomplicated: Secondary | ICD-10-CM

## 2014-11-13 LAB — POCT URINALYSIS DIP (DEVICE)
Bilirubin Urine: NEGATIVE
GLUCOSE, UA: NEGATIVE mg/dL
Hgb urine dipstick: NEGATIVE
KETONES UR: NEGATIVE mg/dL
Nitrite: NEGATIVE
Protein, ur: NEGATIVE mg/dL
SPECIFIC GRAVITY, URINE: 1.01 (ref 1.005–1.030)
UROBILINOGEN UA: 0.2 mg/dL (ref 0.0–1.0)
pH: 6.5 (ref 5.0–8.0)

## 2014-11-13 LAB — CYTOLOGY - PAP

## 2014-11-13 NOTE — Progress Notes (Signed)
States starting to have back pain again like she had first baby. C/o contractions every day- states when they come - has a few(4-5) in a row, then they stop- states they don't hurt.

## 2014-11-13 NOTE — Progress Notes (Signed)
Patient is doing well without complaints. FM/PTL precautions reviewed. Patient had a follow up growth ultrasound today but report is not available for review. Patient with persistent lower back pains- encouraged stretching and yoga in pregnancy, maternity support belt, massage and tylenol. Flu shot today

## 2014-11-17 LAB — OPIATES/OPIOIDS (LC/MS-MS)
CODEINE URINE: NEGATIVE ng/mL (ref <50)
HYDROMORPHONE: 243 ng/mL — AB (ref <50)
Hydrocodone: 244 ng/mL — AB (ref <50)
MORPHINE: NEGATIVE ng/mL (ref <50)
NORHYDROCODONE, UR: 918 ng/mL — AB (ref <50)
NOROXYCODONE, UR: 840 ng/mL — AB (ref <50)
Oxycodone, ur: 130 ng/mL — AB (ref <50)
Oxymorphone: 253 ng/mL — AB (ref <50)

## 2014-11-17 LAB — PRESCRIPTION MONITORING PROFILE (19 PANEL)
Barbiturate Screen, Urine: NEGATIVE ng/mL
Buprenorphine, Urine: NEGATIVE ng/mL
COCAINE METABOLITES: NEGATIVE ng/mL
CREATININE, URINE: 119.96 mg/dL (ref >20.0)
Carisoprodol, Urine: NEGATIVE ng/mL
Fentanyl, Ur: NEGATIVE ng/mL
MDMA URINE: NEGATIVE ng/mL
METHAQUALONE SCREEN (URINE): NEGATIVE ng/mL
Meperidine, Ur: NEGATIVE ng/mL
Methadone Screen, Urine: NEGATIVE ng/mL
Nitrites, Initial: NEGATIVE ug/mL
PHENCYCLIDINE, UR: NEGATIVE ng/mL
Propoxyphene: NEGATIVE ng/mL
TAPENTADOLUR: NEGATIVE ng/mL
Tramadol Scrn, Ur: NEGATIVE ng/mL
Zolpidem, Urine: NEGATIVE ng/mL
pH, Initial: 7 pH (ref 4.5–8.9)

## 2014-11-17 LAB — BENZODIAZEPINES (GC/LC/MS), URINE
ALPRAZOLAMU: 67 ng/mL — AB (ref <25)
CLONAZEPAU: NEGATIVE ng/mL (ref <25)
Flurazepam metabolite (GC/LC/MS), ur confirm: NEGATIVE ng/mL (ref <50)
LORAZEPAMU: NEGATIVE ng/mL (ref <50)
MIDAZOLAMU: NEGATIVE ng/mL (ref <50)
Nordiazepam (GC/LC/MS), ur confirm: NEGATIVE ng/mL (ref <50)
Oxazepam (GC/LC/MS), ur confirm: NEGATIVE ng/mL (ref <50)
Temazepam (GC/LC/MS), ur confirm: NEGATIVE ng/mL (ref <50)
Triazolam metabolite (GC/LC/MS), ur confirm: NEGATIVE ng/mL (ref <50)

## 2014-11-17 LAB — OXYCODONE, URINE (LC/MS-MS)
Noroxycodone, Ur: 840 ng/mL — AB (ref <50)
OXYCODONE, UR: 130 ng/mL — AB (ref <50)
Oxymorphone: 253 ng/mL — AB (ref <50)

## 2014-11-17 LAB — AMPHETAMINES (GC/LC/MS), URINE
Amphetamine GC/MS Conf: 1134 ng/mL — AB (ref <250)
Methamphetamine Quant, Ur: NEGATIVE ng/mL (ref <250)

## 2014-11-17 LAB — CANNABANOIDS (GC/LC/MS), URINE: THC-COOH (GC/LC/MS), ur confirm: 585 ng/mL — AB (ref <5)

## 2014-11-27 ENCOUNTER — Encounter: Payer: Self-pay | Admitting: *Deleted

## 2014-11-27 ENCOUNTER — Ambulatory Visit (INDEPENDENT_AMBULATORY_CARE_PROVIDER_SITE_OTHER): Payer: Medicaid Other | Admitting: Obstetrics & Gynecology

## 2014-11-27 VITALS — BP 106/71 | HR 89 | Temp 97.8°F | Wt 130.3 lb

## 2014-11-27 DIAGNOSIS — K219 Gastro-esophageal reflux disease without esophagitis: Secondary | ICD-10-CM

## 2014-11-27 DIAGNOSIS — O99613 Diseases of the digestive system complicating pregnancy, third trimester: Secondary | ICD-10-CM

## 2014-11-27 DIAGNOSIS — O0993 Supervision of high risk pregnancy, unspecified, third trimester: Secondary | ICD-10-CM

## 2014-11-27 LAB — POCT URINALYSIS DIP (DEVICE)
Glucose, UA: NEGATIVE mg/dL
Hgb urine dipstick: NEGATIVE
Ketones, ur: NEGATIVE mg/dL
NITRITE: NEGATIVE
PH: 6 (ref 5.0–8.0)
PROTEIN: 30 mg/dL — AB
Specific Gravity, Urine: >=1.03 (ref 1.005–1.030)
Urobilinogen, UA: 0.2 mg/dL (ref 0.0–1.0)

## 2014-11-27 MED ORDER — PANTOPRAZOLE SODIUM 40 MG PO TBEC: 40.0000 mg | DELAYED_RELEASE_TABLET | Freq: Every day | ORAL | Status: DC

## 2014-11-27 NOTE — Progress Notes (Signed)
Will want paternity test. Frequent meals, has heartburn. Start Protonix

## 2014-11-27 NOTE — Patient Instructions (Signed)
Third Trimester of Pregnancy The third trimester is from week 29 through week 42, months 7 through 9. The third trimester is a time when the fetus is growing rapidly. At the end of the ninth month, the fetus is about 20 inches in length and weighs 6-10 pounds.  BODY CHANGES Your body goes through many changes during pregnancy. The changes vary from woman to woman.   Your weight will continue to increase. You can expect to gain 25-35 pounds (11-16 kg) by the end of the pregnancy.  You may begin to get stretch marks on your hips, abdomen, and breasts.  You may urinate more often because the fetus is moving lower into your pelvis and pressing on your bladder.  You may develop or continue to have heartburn as a result of your pregnancy.  You may develop constipation because certain hormones are causing the muscles that push waste through your intestines to slow down.  You may develop hemorrhoids or swollen, bulging veins (varicose veins).  You may have pelvic pain because of the weight gain and pregnancy hormones relaxing your joints between the bones in your pelvis. Backaches may result from overexertion of the muscles supporting your posture.  You may have changes in your hair. These can include thickening of your hair, rapid growth, and changes in texture. Some women also have hair loss during or after pregnancy, or hair that feels dry or thin. Your hair will most likely return to normal after your baby is born.  Your breasts will continue to grow and be tender. A yellow discharge may leak from your breasts called colostrum.  Your belly button may stick out.  You may feel short of breath because of your expanding uterus.  You may notice the fetus "dropping," or moving lower in your abdomen.  You may have a bloody mucus discharge. This usually occurs a few days to a week before labor begins.  Your cervix becomes thin and soft (effaced) near your due date. WHAT TO EXPECT AT YOUR PRENATAL  EXAMS  You will have prenatal exams every 2 weeks until week 36. Then, you will have weekly prenatal exams. During a routine prenatal visit:  You will be weighed to make sure you and the fetus are growing normally.  Your blood pressure is taken.  Your abdomen will be measured to track your baby's growth.  The fetal heartbeat will be listened to.  Any test results from the previous visit will be discussed.  You may have a cervical check near your due date to see if you have effaced. At around 36 weeks, your caregiver will check your cervix. At the same time, your caregiver will also perform a test on the secretions of the vaginal tissue. This test is to determine if a type of bacteria, Group B streptococcus, is present. Your caregiver will explain this further. Your caregiver may ask you:  What your birth plan is.  How you are feeling.  If you are feeling the baby move.  If you have had any abnormal symptoms, such as leaking fluid, bleeding, severe headaches, or abdominal cramping.  If you have any questions. Other tests or screenings that may be performed during your third trimester include:  Blood tests that check for low iron levels (anemia).  Fetal testing to check the health, activity level, and growth of the fetus. Testing is done if you have certain medical conditions or if there are problems during the pregnancy. FALSE LABOR You may feel small, irregular contractions that   eventually go away. These are called Braxton Hicks contractions, or false labor. Contractions may last for hours, days, or even weeks before true labor sets in. If contractions come at regular intervals, intensify, or become painful, it is best to be seen by your caregiver.  SIGNS OF LABOR   Menstrual-like cramps.  Contractions that are 5 minutes apart or less.  Contractions that start on the top of the uterus and spread down to the lower abdomen and back.  A sense of increased pelvic pressure or back  pain.  A watery or bloody mucus discharge that comes from the vagina. If you have any of these signs before the 37th week of pregnancy, call your caregiver right away. You need to go to the hospital to get checked immediately. HOME CARE INSTRUCTIONS   Avoid all smoking, herbs, alcohol, and unprescribed drugs. These chemicals affect the formation and growth of the baby.  Follow your caregiver's instructions regarding medicine use. There are medicines that are either safe or unsafe to take during pregnancy.  Exercise only as directed by your caregiver. Experiencing uterine cramps is a good sign to stop exercising.  Continue to eat regular, healthy meals.  Wear a good support bra for breast tenderness.  Do not use hot tubs, steam rooms, or saunas.  Wear your seat belt at all times when driving.  Avoid raw meat, uncooked cheese, cat litter boxes, and soil used by cats. These carry germs that can cause birth defects in the baby.  Take your prenatal vitamins.  Try taking a stool softener (if your caregiver approves) if you develop constipation. Eat more high-fiber foods, such as fresh vegetables or fruit and whole grains. Drink plenty of fluids to keep your urine clear or pale yellow.  Take warm sitz baths to soothe any pain or discomfort caused by hemorrhoids. Use hemorrhoid cream if your caregiver approves.  If you develop varicose veins, wear support hose. Elevate your feet for 15 minutes, 3-4 times a day. Limit salt in your diet.  Avoid heavy lifting, wear low heal shoes, and practice good posture.  Rest a lot with your legs elevated if you have leg cramps or low back pain.  Visit your dentist if you have not gone during your pregnancy. Use a soft toothbrush to brush your teeth and be gentle when you floss.  A sexual relationship may be continued unless your caregiver directs you otherwise.  Do not travel far distances unless it is absolutely necessary and only with the approval  of your caregiver.  Take prenatal classes to understand, practice, and ask questions about the labor and delivery.  Make a trial run to the hospital.  Pack your hospital bag.  Prepare the baby's nursery.  Continue to go to all your prenatal visits as directed by your caregiver. SEEK MEDICAL CARE IF:  You are unsure if you are in labor or if your water has broken.  You have dizziness.  You have mild pelvic cramps, pelvic pressure, or nagging pain in your abdominal area.  You have persistent nausea, vomiting, or diarrhea.  You have a bad smelling vaginal discharge.  You have pain with urination. SEEK IMMEDIATE MEDICAL CARE IF:   You have a fever.  You are leaking fluid from your vagina.  You have spotting or bleeding from your vagina.  You have severe abdominal cramping or pain.  You have rapid weight loss or gain.  You have shortness of breath with chest pain.  You notice sudden or extreme swelling   of your face, hands, ankles, feet, or legs.  You have not felt your baby move in over an hour.  You have severe headaches that do not go away with medicine.  You have vision changes. Document Released: 09/15/2001 Document Revised: 09/26/2013 Document Reviewed: 11/22/2012 Exit[employer] Patient Information 2015 [employer]. This information is not intended to replace advice given to you by your health care provider. Make sure you discuss any questions you have with your health care provider.  

## 2014-11-27 NOTE — Progress Notes (Signed)
Reports pressure and pain in vagina/pelvis

## 2014-11-29 ENCOUNTER — Inpatient Hospital Stay (HOSPITAL_COMMUNITY)
Admission: AD | Admit: 2014-11-29 | Discharge: 2014-11-29 | Disposition: A | Discharge status: Home / Self Care | Payer: Medicaid Other | Source: Ambulatory Visit | Attending: Obstetrics and Gynecology | Admitting: Obstetrics and Gynecology

## 2014-11-29 ENCOUNTER — Encounter (HOSPITAL_COMMUNITY): Payer: Self-pay

## 2014-11-29 DIAGNOSIS — Z87891 Personal history of nicotine dependence: Secondary | ICD-10-CM | POA: Insufficient documentation

## 2014-11-29 DIAGNOSIS — K2100 Gastro-esophageal reflux disease with esophagitis, without bleeding: Secondary | ICD-10-CM

## 2014-11-29 DIAGNOSIS — O99613 Diseases of the digestive system complicating pregnancy, third trimester: Secondary | ICD-10-CM | POA: Diagnosis not present

## 2014-11-29 DIAGNOSIS — J029 Acute pharyngitis, unspecified: Secondary | ICD-10-CM | POA: Diagnosis present

## 2014-11-29 DIAGNOSIS — Z3A33 33 weeks gestation of pregnancy: Secondary | ICD-10-CM

## 2014-11-29 MED ORDER — PROMETHAZINE HCL 25 MG PO TABS
25.0000 mg | ORAL_TABLET | Freq: Once | ORAL | Status: AC
  Administered 2014-11-29: 25 mg via ORAL
  Filled 2014-11-29: qty 1

## 2014-11-29 MED ORDER — PROMETHAZINE HCL 25 MG PO TABS: 25.0000 mg | ORAL_TABLET | Freq: Once | ORAL | Status: DC

## 2014-11-29 MED ORDER — GI COCKTAIL ~~LOC~~
30.0000 mL | Freq: Once | ORAL | Status: AC
  Administered 2014-11-29: 30 mL via ORAL
  Filled 2014-11-29: qty 30
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
## 2014-11-29 NOTE — Discharge Instructions (Signed)

## 2014-11-29 NOTE — MAU Note (Signed)
Burning, painful esophagus.  Started Protonix this morning.  No leaking fluid, no vag bleeding.  Baby moving well.

## 2014-11-30 ENCOUNTER — Encounter: Payer: Self-pay | Admitting: General Practice

## 2014-12-11 ENCOUNTER — Ambulatory Visit (HOSPITAL_COMMUNITY)
Admission: RE | Admit: 2014-12-11 | Discharge: 2014-12-11 | Disposition: A | Discharge status: Home / Self Care | Payer: Medicaid Other | Source: Ambulatory Visit | Attending: Obstetrics & Gynecology | Admitting: Obstetrics & Gynecology

## 2014-12-11 DIAGNOSIS — Z3A35 35 weeks gestation of pregnancy: Secondary | ICD-10-CM | POA: Insufficient documentation

## 2014-12-11 DIAGNOSIS — O43109 Malformation of placenta, unspecified, unspecified trimester: Secondary | ICD-10-CM | POA: Insufficient documentation

## 2014-12-11 DIAGNOSIS — O0993 Supervision of high risk pregnancy, unspecified, third trimester: Secondary | ICD-10-CM

## 2014-12-13 ENCOUNTER — Encounter: Payer: Medicaid Other | Admitting: Family Medicine

## 2014-12-20 ENCOUNTER — Ambulatory Visit (INDEPENDENT_AMBULATORY_CARE_PROVIDER_SITE_OTHER): Payer: Medicaid Other | Admitting: Family Medicine

## 2014-12-20 ENCOUNTER — Other Ambulatory Visit: Payer: Self-pay | Admitting: Family Medicine

## 2014-12-20 VITALS — BP 119/71 | HR 80 | Wt 137.8 lb

## 2014-12-20 DIAGNOSIS — O99322 Drug use complicating pregnancy, second trimester: Secondary | ICD-10-CM

## 2014-12-20 DIAGNOSIS — F1990 Other psychoactive substance use, unspecified, uncomplicated: Secondary | ICD-10-CM

## 2014-12-20 DIAGNOSIS — F319 Bipolar disorder, unspecified: Secondary | ICD-10-CM

## 2014-12-20 DIAGNOSIS — F1910 Other psychoactive substance abuse, uncomplicated: Secondary | ICD-10-CM

## 2014-12-20 DIAGNOSIS — O0993 Supervision of high risk pregnancy, unspecified, third trimester: Secondary | ICD-10-CM

## 2014-12-20 LAB — POCT URINALYSIS DIP (DEVICE)
Bilirubin Urine: NEGATIVE
Glucose, UA: NEGATIVE mg/dL
HGB URINE DIPSTICK: NEGATIVE
Ketones, ur: NEGATIVE mg/dL
LEUKOCYTES UA: NEGATIVE
NITRITE: NEGATIVE
PROTEIN: 30 mg/dL — AB
UROBILINOGEN UA: 0.2 mg/dL (ref 0.0–1.0)
pH: 6 (ref 5.0–8.0)

## 2014-12-20 LAB — OB RESULTS CONSOLE GC/CHLAMYDIA
CHLAMYDIA, DNA PROBE: NEGATIVE
Gonorrhea: NEGATIVE

## 2014-12-20 LAB — OB RESULTS CONSOLE GBS: STREP GROUP B AG: POSITIVE

## 2014-12-20 NOTE — Patient Instructions (Signed)
Third Trimester of Pregnancy The third trimester is from week 29 through week 42, months 7 through 9. The third trimester is a time when the fetus is growing rapidly. At the end of the ninth month, the fetus is about 20 inches in length and weighs 6-10 pounds.  BODY CHANGES Your body goes through many changes during pregnancy. The changes vary from woman to woman.   Your weight will continue to increase. You can expect to gain 25-35 pounds (11-16 kg) by the end of the pregnancy.  You may begin to get stretch marks on your hips, abdomen, and breasts.  You may urinate more often because the fetus is moving lower into your pelvis and pressing on your bladder.  You may develop or continue to have heartburn as a result of your pregnancy.  You may develop constipation because certain hormones are causing the muscles that push waste through your intestines to slow down.  You may develop hemorrhoids or swollen, bulging veins (varicose veins).  You may have pelvic pain because of the weight gain and pregnancy hormones relaxing your joints between the bones in your pelvis. Backaches may result from overexertion of the muscles supporting your posture.  You may have changes in your hair. These can include thickening of your hair, rapid growth, and changes in texture. Some women also have hair loss during or after pregnancy, or hair that feels dry or thin. Your hair will most likely return to normal after your baby is born.  Your breasts will continue to grow and be tender. A yellow discharge may leak from your breasts called colostrum.  Your belly button may stick out.  You may feel short of breath because of your expanding uterus.  You may notice the fetus "dropping," or moving lower in your abdomen.  You may have a bloody mucus discharge. This usually occurs a few days to a week before labor begins.  Your cervix becomes thin and soft (effaced) near your due date. WHAT TO EXPECT AT YOUR PRENATAL  EXAMS  You will have prenatal exams every 2 weeks until week 36. Then, you will have weekly prenatal exams. During a routine prenatal visit:  You will be weighed to make sure you and the fetus are growing normally.  Your blood pressure is taken.  Your abdomen will be measured to track your baby's growth.  The fetal heartbeat will be listened to.  Any test results from the previous visit will be discussed.  You may have a cervical check near your due date to see if you have effaced. At around 36 weeks, your caregiver will check your cervix. At the same time, your caregiver will also perform a test on the secretions of the vaginal tissue. This test is to determine if a type of bacteria, Group B streptococcus, is present. Your caregiver will explain this further. Your caregiver may ask you:  What your birth plan is.  How you are feeling.  If you are feeling the baby move.  If you have had any abnormal symptoms, such as leaking fluid, bleeding, severe headaches, or abdominal cramping.  If you have any questions. Other tests or screenings that may be performed during your third trimester include:  Blood tests that check for low iron levels (anemia).  Fetal testing to check the health, activity level, and growth of the fetus. Testing is done if you have certain medical conditions or if there are problems during the pregnancy. FALSE LABOR You may feel small, irregular contractions that   eventually go away. These are called Braxton Hicks contractions, or false labor. Contractions may last for hours, days, or even weeks before true labor sets in. If contractions come at regular intervals, intensify, or become painful, it is best to be seen by your caregiver.  SIGNS OF LABOR   Menstrual-like cramps.  Contractions that are 5 minutes apart or less.  Contractions that start on the top of the uterus and spread down to the lower abdomen and back.  A sense of increased pelvic pressure or back  pain.  A watery or bloody mucus discharge that comes from the vagina. If you have any of these signs before the 37th week of pregnancy, call your caregiver right away. You need to go to the hospital to get checked immediately. HOME CARE INSTRUCTIONS   Avoid all smoking, herbs, alcohol, and unprescribed drugs. These chemicals affect the formation and growth of the baby.  Follow your caregiver's instructions regarding medicine use. There are medicines that are either safe or unsafe to take during pregnancy.  Exercise only as directed by your caregiver. Experiencing uterine cramps is a good sign to stop exercising.  Continue to eat regular, healthy meals.  Wear a good support bra for breast tenderness.  Do not use hot tubs, steam rooms, or saunas.  Wear your seat belt at all times when driving.  Avoid raw meat, uncooked cheese, cat litter boxes, and soil used by cats. These carry germs that can cause birth defects in the baby.  Take your prenatal vitamins.  Try taking a stool softener (if your caregiver approves) if you develop constipation. Eat more high-fiber foods, such as fresh vegetables or fruit and whole grains. Drink plenty of fluids to keep your urine clear or pale yellow.  Take warm sitz baths to soothe any pain or discomfort caused by hemorrhoids. Use hemorrhoid cream if your caregiver approves.  If you develop varicose veins, wear support hose. Elevate your feet for 15 minutes, 3-4 times a day. Limit salt in your diet.  Avoid heavy lifting, wear low heal shoes, and practice good posture.  Rest a lot with your legs elevated if you have leg cramps or low back pain.  Visit your dentist if you have not gone during your pregnancy. Use a soft toothbrush to brush your teeth and be gentle when you floss.  A sexual relationship may be continued unless your caregiver directs you otherwise.  Do not travel far distances unless it is absolutely necessary and only with the approval  of your caregiver.  Take prenatal classes to understand, practice, and ask questions about the labor and delivery.  Make a trial run to the hospital.  Pack your hospital bag.  Prepare the baby's nursery.  Continue to go to all your prenatal visits as directed by your caregiver. SEEK MEDICAL CARE IF:  You are unsure if you are in labor or if your water has broken.  You have dizziness.  You have mild pelvic cramps, pelvic pressure, or nagging pain in your abdominal area.  You have persistent nausea, vomiting, or diarrhea.  You have a bad smelling vaginal discharge.  You have pain with urination. SEEK IMMEDIATE MEDICAL CARE IF:   You have a fever.  You are leaking fluid from your vagina.  You have spotting or bleeding from your vagina.  You have severe abdominal cramping or pain.  You have rapid weight loss or gain.  You have shortness of breath with chest pain.  You notice sudden or extreme swelling   of your face, hands, ankles, feet, or legs.  You have not felt your baby move in over an hour.  You have severe headaches that do not go away with medicine.  You have vision changes. Document Released: 09/15/2001 Document Revised: 09/26/2013 Document Reviewed: 11/22/2012 Exit[employer] Patient Information 2015 [employer]. This information is not intended to replace advice given to you by your health care provider. Make sure you discuss any questions you have with your health care provider.  

## 2014-12-20 NOTE — Progress Notes (Signed)
Protonix helping quite a bit.  No complaints. Intermittent ctx.

## 2014-12-20 NOTE — Progress Notes (Signed)
Still having heartburn. Picking up refill on Protonix today.

## 2014-12-21 LAB — GC/CHLAMYDIA PROBE AMP
CT Probe RNA: NEGATIVE
GC PROBE AMP APTIMA: NEGATIVE

## 2014-12-22 LAB — CULTURE, BETA STREP (GROUP B ONLY)

## 2014-12-24 ENCOUNTER — Inpatient Hospital Stay (HOSPITAL_COMMUNITY)
Admission: AD | Admit: 2014-12-24 | Discharge: 2014-12-24 | Disposition: A | Discharge status: Home / Self Care | Payer: Medicaid Other | Source: Ambulatory Visit | Attending: Obstetrics & Gynecology | Admitting: Obstetrics & Gynecology

## 2014-12-24 DIAGNOSIS — Z3A37 37 weeks gestation of pregnancy: Secondary | ICD-10-CM | POA: Insufficient documentation

## 2014-12-24 DIAGNOSIS — O26899 Other specified pregnancy related conditions, unspecified trimester: Secondary | ICD-10-CM

## 2014-12-24 DIAGNOSIS — R109 Unspecified abdominal pain: Secondary | ICD-10-CM | POA: Diagnosis not present

## 2014-12-24 DIAGNOSIS — O9989 Other specified diseases and conditions complicating pregnancy, childbirth and the puerperium: Secondary | ICD-10-CM | POA: Insufficient documentation

## 2014-12-24 DIAGNOSIS — Z87891 Personal history of nicotine dependence: Secondary | ICD-10-CM

## 2014-12-24 DIAGNOSIS — M549 Dorsalgia, unspecified: Secondary | ICD-10-CM | POA: Diagnosis not present

## 2014-12-24 DIAGNOSIS — O99891 Other specified diseases and conditions complicating pregnancy: Secondary | ICD-10-CM

## 2014-12-24 MED ORDER — NALBUPHINE HCL 10 MG/ML IJ SOLN
10.0000 mg | INTRAMUSCULAR | Status: AC
  Administered 2014-12-24: 10 mg via INTRAMUSCULAR
  Filled 2014-12-24: qty 1
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
## 2014-12-24 NOTE — MAU Note (Signed)
Contractions started yesterday, but stronger today. No leaking or bleeding.

## 2014-12-27 ENCOUNTER — Encounter: Payer: Medicaid Other | Admitting: Family Medicine

## 2014-12-28 ENCOUNTER — Inpatient Hospital Stay (HOSPITAL_COMMUNITY)
Admission: AD | Admit: 2014-12-28 | Discharge: 2014-12-30 | DRG: 781 | Disposition: A | Discharge status: Home / Self Care | Payer: Medicaid Other | Source: Ambulatory Visit | Attending: Family Medicine | Admitting: Family Medicine

## 2014-12-28 ENCOUNTER — Encounter (HOSPITAL_COMMUNITY): Payer: Self-pay | Admitting: *Deleted

## 2014-12-28 DIAGNOSIS — O2303 Infections of kidney in pregnancy, third trimester: Principal | ICD-10-CM | POA: Diagnosis present

## 2014-12-28 DIAGNOSIS — F17210 Nicotine dependence, cigarettes, uncomplicated: Secondary | ICD-10-CM | POA: Diagnosis present

## 2014-12-28 DIAGNOSIS — Z8249 Family history of ischemic heart disease and other diseases of the circulatory system: Secondary | ICD-10-CM

## 2014-12-28 DIAGNOSIS — Z3A37 37 weeks gestation of pregnancy: Secondary | ICD-10-CM | POA: Diagnosis present

## 2014-12-28 DIAGNOSIS — O99333 Smoking (tobacco) complicating pregnancy, third trimester: Secondary | ICD-10-CM | POA: Diagnosis present

## 2014-12-28 DIAGNOSIS — O99820 Streptococcus B carrier state complicating pregnancy: Secondary | ICD-10-CM | POA: Diagnosis present

## 2014-12-28 DIAGNOSIS — B9620 Unspecified Escherichia coli [E. coli] as the cause of diseases classified elsewhere: Secondary | ICD-10-CM | POA: Diagnosis present

## 2014-12-28 LAB — URINALYSIS, ROUTINE W REFLEX MICROSCOPIC
Bilirubin Urine: NEGATIVE
GLUCOSE, UA: NEGATIVE mg/dL
KETONES UR: 15 mg/dL — AB
Nitrite: POSITIVE — AB
Protein, ur: NEGATIVE mg/dL
Specific Gravity, Urine: 1.01 (ref 1.005–1.030)
Urobilinogen, UA: 0.2 mg/dL (ref 0.0–1.0)
pH: 7.5 (ref 5.0–8.0)

## 2014-12-28 LAB — URINE MICROSCOPIC-ADD ON

## 2014-12-28 NOTE — MAU Note (Signed)
Pain in R flank since last night. Sometimes hurts in stomach. Think i have had a fever. Sweating some.

## 2014-12-29 DIAGNOSIS — N12 Tubulo-interstitial nephritis, not specified as acute or chronic: Secondary | ICD-10-CM

## 2014-12-29 DIAGNOSIS — R109 Unspecified abdominal pain: Secondary | ICD-10-CM | POA: Diagnosis present

## 2014-12-29 DIAGNOSIS — O2303 Infections of kidney in pregnancy, third trimester: Secondary | ICD-10-CM | POA: Diagnosis present

## 2014-12-29 DIAGNOSIS — B9620 Unspecified Escherichia coli [E. coli] as the cause of diseases classified elsewhere: Secondary | ICD-10-CM | POA: Diagnosis present

## 2014-12-29 DIAGNOSIS — F17210 Nicotine dependence, cigarettes, uncomplicated: Secondary | ICD-10-CM | POA: Diagnosis present

## 2014-12-29 DIAGNOSIS — O99333 Smoking (tobacco) complicating pregnancy, third trimester: Secondary | ICD-10-CM | POA: Diagnosis present

## 2014-12-29 DIAGNOSIS — Z8249 Family history of ischemic heart disease and other diseases of the circulatory system: Secondary | ICD-10-CM | POA: Diagnosis not present

## 2014-12-29 DIAGNOSIS — O99820 Streptococcus B carrier state complicating pregnancy: Secondary | ICD-10-CM | POA: Diagnosis present

## 2014-12-29 DIAGNOSIS — Z3A37 37 weeks gestation of pregnancy: Secondary | ICD-10-CM | POA: Diagnosis present

## 2014-12-29 LAB — COMPREHENSIVE METABOLIC PANEL
ALT: 11 U/L (ref 0–35)
ANION GAP: 12 (ref 5–15)
AST: 18 U/L (ref 0–37)
Albumin: 2.7 g/dL — ABNORMAL LOW (ref 3.5–5.2)
Alkaline Phosphatase: 139 U/L — ABNORMAL HIGH (ref 39–117)
BUN: 6 mg/dL (ref 6–23)
CALCIUM: 8.3 mg/dL — AB (ref 8.4–10.5)
CO2: 19 mmol/L (ref 19–32)
Chloride: 102 mmol/L (ref 96–112)
Creatinine, Ser: 0.55 mg/dL (ref 0.50–1.10)
GFR calc Af Amer: >90 mL/min (ref >90)
Glucose, Bld: 63 mg/dL — ABNORMAL LOW (ref 70–99)
Potassium: 3.7 mmol/L (ref 3.5–5.1)
Sodium: 133 mmol/L — ABNORMAL LOW (ref 135–145)
TOTAL PROTEIN: 6 g/dL (ref 6.0–8.3)
Total Bilirubin: 0.3 mg/dL (ref 0.3–1.2)

## 2014-12-29 LAB — CBC WITH DIFFERENTIAL/PLATELET
Basophils Absolute: 0 10*3/uL (ref 0.0–0.1)
Basophils Relative: 0 % (ref 0–1)
EOS PCT: 0 % (ref 0–5)
Eosinophils Absolute: 0 10*3/uL (ref 0.0–0.7)
HCT: 31.5 % — ABNORMAL LOW (ref 36.0–46.0)
Hemoglobin: 10.7 g/dL — ABNORMAL LOW (ref 12.0–15.0)
LYMPHS ABS: 2.5 10*3/uL (ref 0.7–4.0)
LYMPHS PCT: 9 % — AB (ref 12–46)
MCH: 31.6 pg (ref 26.0–34.0)
MCHC: 34 g/dL (ref 30.0–36.0)
MCV: 92.9 fL (ref 78.0–100.0)
Monocytes Absolute: 2.5 10*3/uL — ABNORMAL HIGH (ref 0.1–1.0)
Monocytes Relative: 9 % (ref 3–12)
NEUTROS ABS: 22.7 10*3/uL — AB (ref 1.7–7.7)
Neutrophils Relative %: 82 % — ABNORMAL HIGH (ref 43–77)
PLATELETS: 327 10*3/uL (ref 150–400)
RBC: 3.39 MIL/uL — AB (ref 3.87–5.11)
RDW: 13.8 % (ref 11.5–15.5)
WBC: 27.7 10*3/uL — AB (ref 4.0–10.5)

## 2014-12-29 LAB — CBC
HCT: 32 % — ABNORMAL LOW (ref 36.0–46.0)
HEMOGLOBIN: 10.7 g/dL — AB (ref 12.0–15.0)
MCH: 31.3 pg (ref 26.0–34.0)
MCHC: 33.4 g/dL (ref 30.0–36.0)
MCV: 93.6 fL (ref 78.0–100.0)
Platelets: 344 10*3/uL (ref 150–400)
RBC: 3.42 MIL/uL — AB (ref 3.87–5.11)
RDW: 14 % (ref 11.5–15.5)
WBC: 26 10*3/uL — AB (ref 4.0–10.5)

## 2014-12-29 MED ORDER — FAMOTIDINE 20 MG PO TABS: 20.0000 mg | ORAL_TABLET | Freq: Every day | ORAL | Status: DC | PRN

## 2014-12-29 MED ORDER — HYDROCODONE-ACETAMINOPHEN 5-325 MG PO TABS
1.0000 | ORAL_TABLET | Freq: Four times a day (QID) | ORAL | Status: DC | PRN
Start: 2014-12-29 — End: 2014-12-30
  Administered 2014-12-29 – 2014-12-30 (×5): 2 via ORAL
  Filled 2014-12-29 (×5): qty 2

## 2014-12-29 MED ORDER — LACTATED RINGERS IV SOLN
INTRAVENOUS | Status: DC
  Administered 2014-12-29: 01:00:00 via INTRAVENOUS

## 2014-12-29 MED ORDER — PROMETHAZINE HCL 25 MG PO TABS
25.0000 mg | ORAL_TABLET | Freq: Four times a day (QID) | ORAL | Status: DC | PRN
  Administered 2014-12-29 (×2): 25 mg via ORAL
  Filled 2014-12-29 (×2): qty 1

## 2014-12-29 MED ORDER — ACETAMINOPHEN 325 MG PO TABS: 650.0000 mg | ORAL_TABLET | ORAL | Status: DC | PRN

## 2014-12-29 MED ORDER — NICOTINE 21 MG/24HR TD PT24
21.0000 mg | MEDICATED_PATCH | Freq: Every day | TRANSDERMAL | Status: DC
  Administered 2014-12-29 – 2014-12-30 (×2): 21 mg via TRANSDERMAL
  Filled 2014-12-29 (×2): qty 1

## 2014-12-29 MED ORDER — CALCIUM CARBONATE ANTACID 500 MG PO CHEW: 2.0000 | CHEWABLE_TABLET | ORAL | Status: DC | PRN

## 2014-12-29 MED ORDER — DOCUSATE SODIUM 100 MG PO CAPS
100.0000 mg | ORAL_CAPSULE | Freq: Every day | ORAL | Status: DC
  Administered 2014-12-29 – 2014-12-30 (×2): 100 mg via ORAL
  Filled 2014-12-29 (×2): qty 1

## 2014-12-29 MED ORDER — QUETIAPINE FUMARATE 100 MG PO TABS
100.0000 mg | ORAL_TABLET | Freq: Every day | ORAL | Status: DC
  Administered 2014-12-29 (×2): 100 mg via ORAL
  Filled 2014-12-29 (×2): qty 1

## 2014-12-29 MED ORDER — ALPRAZOLAM 0.5 MG PO TABS
1.0000 mg | ORAL_TABLET | Freq: Every day | ORAL | Status: DC
  Administered 2014-12-29 (×2): 1 mg via ORAL
  Filled 2014-12-29 (×2): qty 2

## 2014-12-29 MED ORDER — SODIUM CHLORIDE 0.9 % IV SOLN
INTRAVENOUS | Status: DC
  Administered 2014-12-29 – 2014-12-30 (×4): via INTRAVENOUS

## 2014-12-29 MED ORDER — PRENATAL MULTIVITAMIN CH
1.0000 | ORAL_TABLET | Freq: Every day | ORAL | Status: DC
  Administered 2014-12-29 – 2014-12-30 (×2): 1 via ORAL
  Filled 2014-12-29 (×2): qty 1

## 2014-12-29 MED ORDER — CEFTRIAXONE SODIUM IN DEXTROSE 20 MG/ML IV SOLN
1.0000 g | Freq: Two times a day (BID) | INTRAVENOUS | Status: DC
  Administered 2014-12-29 – 2014-12-30 (×3): 1 g via INTRAVENOUS
  Filled 2014-12-29 (×4): qty 50

## 2014-12-29 NOTE — Progress Notes (Signed)
Attempted visit with patient.  Informed that she in the process of falling asleep and requested that this writer return at a later time.

## 2014-12-29 NOTE — Progress Notes (Signed)
Patient's mother requesting nicotine patch for the patient. Patient's mother states the patient currently smokes  a pack a day of cigarettes. Patient already recently received Xanax, Vicodin, Phenergan, and Seroquel at this time. Placed a call to [facility] CNM to receive order for nicotine patch. Before order can be put in, patient exits room and mother chases her telling to get back in bed and patient refuses. Patient advised she should not leave unit to smoke cigarettes while of course pregnant but also not safe to leave the unit after receiving particular medications. Patient becomes belligerent and hostile adamant about going outside to smoke and that she will leave because we cannot keep her from smoking. Patient and mother left unit. Security notified of situation as well as Adrian BlackwaterStinson MD and [employer] CNM

## 2014-12-29 NOTE — Progress Notes (Signed)
RN went to check on patient for hourly rounding, patient not in room. RN walked outside to find patient smoking a cigarette on the corner. RN spoke with patient about importance of not leaving without letting someone know, and that smoking is not allowed on hospital campus. Patient very apologetic and verbalizes understanding; states she also removed nicotine patch before leaving room. Patient back to bed, reminded how to use call bell and not to leave without someone knowing. Will continue to monitor patient closely.

## 2014-12-29 NOTE — MAU Note (Addendum)
Report called to Women's Unit.  Patient to be admitted to room 306.

## 2014-12-29 NOTE — Progress Notes (Signed)
Spoke with Dr. Erin FullingHarraway-Smith. Pt's FHR tracing is not reactive. FHR baseline initally was 150 bpm, now it is 140, no decels, 1 10x10 accel. Minimal variability. Pt has not eaten breakfast yet , she has ordered food. I asked if I could give the pt a fluid bolus, but Dr. Erin FullingHarraway-Smith wants me to repeat the NST after she eats. Pt has also had pain meds at 0900.
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
## 2014-12-30 MED ORDER — NITROFURANTOIN MONOHYD MACRO 100 MG PO CAPS: 100.0000 mg | ORAL_CAPSULE | Freq: Every day | ORAL | Status: DC

## 2014-12-30 MED ORDER — HYDROCODONE-ACETAMINOPHEN 5-325 MG PO TABS: 1.0000 | ORAL_TABLET | Freq: Four times a day (QID) | ORAL | Status: DC | PRN

## 2014-12-30 MED ORDER — FLUCONAZOLE 150 MG PO TABS: 150.0000 mg | ORAL_TABLET | Freq: Every day | ORAL | Status: DC

## 2014-12-30 MED ORDER — CEFUROXIME AXETIL 250 MG PO TABS: 250.0000 mg | ORAL_TABLET | Freq: Two times a day (BID) | ORAL | Status: DC

## 2014-12-30 NOTE — Discharge Instructions (Signed)
Pyelonephritis, Adult °Pyelonephritis is a kidney infection. In general, there are 2 main types of pyelonephritis: °· Infections that come on quickly without any warning (acute pyelonephritis). °· Infections that persist for a long period of time (chronic pyelonephritis). °CAUSES  °Two main causes of pyelonephritis are: °· Bacteria traveling from the bladder to the kidney. This is a problem especially in pregnant women. The urine in the bladder can become filled with bacteria from multiple causes, including: °¨ Inflammation of the prostate gland (prostatitis). °¨ Sexual intercourse in females. °¨ Bladder infection (cystitis). °· Bacteria traveling from the bloodstream to the tissue part of the kidney. °Problems that may increase your risk of getting a kidney infection include: °· Diabetes. °· Kidney stones or bladder stones. °· Cancer. °· Catheters placed in the bladder. °· Other abnormalities of the kidney or ureter. °SYMPTOMS  °· Abdominal pain. °· Pain in the side or flank area. °· Fever. °· Chills. °· Upset stomach. °· Blood in the urine (dark urine). °· Frequent urination. °· Strong or persistent urge to urinate. °· Burning or stinging when urinating. °DIAGNOSIS  °Your caregiver may diagnose your kidney infection based on your symptoms. A urine sample may also be taken. °TREATMENT  °In general, treatment depends on how severe the infection is.  °· If the infection is mild and caught early, your caregiver may treat you with oral antibiotics and send you home. °· If the infection is more severe, the bacteria may have gotten into the bloodstream. This will require intravenous (IV) antibiotics and a hospital stay. Symptoms may include: °¨ High fever. °¨ Severe flank pain. °¨ Shaking chills. °· Even after a hospital stay, your caregiver may require you to be on oral antibiotics for a period of time. °· Other treatments may be required depending upon the cause of the infection. °HOME CARE INSTRUCTIONS  °· Take your  antibiotics as directed. Finish them even if you start to feel better. °· Make an appointment to have your urine checked to make sure the infection is gone. °· Drink enough fluids to keep your urine clear or pale yellow. °· Take medicines for the bladder if you have urgency and frequency of urination as directed by your caregiver. °SEEK IMMEDIATE MEDICAL CARE IF:  °· You have a fever or persistent symptoms for more than 2-3 days. °· You have a fever and your symptoms suddenly get worse. °· You are unable to take your antibiotics or fluids. °· You develop shaking chills. °· You experience extreme weakness or fainting. °· There is no improvement after 2 days of treatment. °MAKE SURE YOU: °· Understand these instructions. °· Will watch your condition. °· Will get help right away if you are not doing well or get worse. °Document Released: 09/21/2005 Document Revised: 03/22/2012 Document Reviewed: 02/25/2011 °[employer]® Patient Information ©2015 [employer]. This information is not intended to replace advice given to you by your health care provider. Make sure you discuss any questions you have with your health care provider. ° °

## 2014-12-30 NOTE — Discharge Summary (Addendum)
Antenatal Physician Discharge Summary  Patient ID: Caitlin Berger MRN: [id number] DOB/AGE: Oct 21, 1985 29 y.o.  Admit date: 12/28/2014 Discharge date: 12/30/2014  Admission Diagnoses: Principal Problem:   Pyelonephritis affecting pregnancy in third trimester, antepartum  Discharge Diagnoses: Same  Prenatal Procedures: NST  Consults: Neonatology, Maternal Fetal Medicine  Significant Diagnostic Studies:  Results for orders placed or performed during the hospital encounter of 12/28/14 (from the past 168 hour(s))  Urine culture   Collection Time: 12/28/14 10:50 PM  Result Value Ref Range   Specimen Description URINE, CLEAN CATCH    Special Requests NONE    Colony Count      >=100,000 COLONIES/ML Performed at [facility]    Culture      ESCHERICHIA COLI Performed at [facility]    Report Status PENDING   Urinalysis, Routine w reflex microscopic   Collection Time: 12/28/14 10:50 PM  Result Value Ref Range   Color, Urine YELLOW YELLOW   APPearance CLEAR CLEAR   Specific Gravity, Urine 1.010 1.005 - 1.030   pH 7.5 5.0 - 8.0   Glucose, UA NEGATIVE NEGATIVE mg/dL   Hgb urine dipstick TRACE (A) NEGATIVE   Bilirubin Urine NEGATIVE NEGATIVE   Ketones, ur 15 (A) NEGATIVE mg/dL   Protein, ur NEGATIVE NEGATIVE mg/dL   Urobilinogen, UA 0.2 0.0 - 1.0 mg/dL   Nitrite POSITIVE (A) NEGATIVE   Leukocytes, UA MODERATE (A) NEGATIVE  Urine microscopic-add on   Collection Time: 12/28/14 10:50 PM  Result Value Ref Range   Squamous Epithelial / LPF RARE RARE   WBC, UA 7-10 <3 WBC/hpf   RBC / HPF 0-2 <3 RBC/hpf   Bacteria, UA MANY (A) RARE  CBC with Differential   Collection Time: 12/29/14 12:40 AM  Result Value Ref Range   WBC 27.7 (H) 4.0 - 10.5 K/uL   RBC 3.39 (L) 3.87 - 5.11 MIL/uL   Hemoglobin 10.7 (L) 12.0 - 15.0 g/dL   HCT 91.4 (L) 78.2 - 95.6 %   MCV 92.9 78.0 - 100.0 fL   MCH 31.6 26.0 - 34.0 pg   MCHC 34.0 30.0 - 36.0 g/dL   RDW 21.3 08.6 - 57.8 %   Platelets 327 150 - 400 K/uL   Neutrophils Relative % 82 (H) 43 - 77 %   Lymphocytes Relative 9 (L) 12 - 46 %   Monocytes Relative 9 3 - 12 %   Eosinophils Relative 0 0 - 5 %   Basophils Relative 0 0 - 1 %   Neutro Abs 22.7 (H) 1.7 - 7.7 K/uL   Lymphs Abs 2.5 0.7 - 4.0 K/uL   Monocytes Absolute 2.5 (H) 0.1 - 1.0 K/uL   Eosinophils Absolute 0.0 0.0 - 0.7 K/uL   Basophils Absolute 0.0 0.0 - 0.1 K/uL   Smear Review MORPHOLOGY UNREMARKABLE   CBC   Collection Time: 12/29/14  1:15 AM  Result Value Ref Range   WBC 26.0 (H) 4.0 - 10.5 K/uL   RBC 3.42 (L) 3.87 - 5.11 MIL/uL   Hemoglobin 10.7 (L) 12.0 - 15.0 g/dL   HCT 46.9 (L) 62.9 - 52.8 %   MCV 93.6 78.0 - 100.0 fL   MCH 31.3 26.0 - 34.0 pg   MCHC 33.4 30.0 - 36.0 g/dL   RDW 41.3 24.4 - 01.0 %   Platelets 344 150 - 400 K/uL  Comprehensive metabolic panel   Collection Time: 12/29/14  1:15 AM  Result Value Ref Range   Sodium 133 (L) 135 - 145  mmol/L   Potassium 3.7 3.5 - 5.1 mmol/L   Chloride 102 96 - 112 mmol/L   CO2 19 19 - 32 mmol/L   Glucose, Bld 63 (L) 70 - 99 mg/dL   BUN 6 6 - 23 mg/dL   Creatinine, Ser 1.61 0.50 - 1.10 mg/dL   Calcium 8.3 (L) 8.4 - 10.5 mg/dL   Total Protein 6.0 6.0 - 8.3 g/dL   Albumin 2.7 (L) 3.5 - 5.2 g/dL   AST 18 0 - 37 U/L   ALT 11 0 - 35 U/L   Alkaline Phosphatase 139 (H) 39 - 117 U/L   Total Bilirubin 0.3 0.3 - 1.2 mg/dL   GFR calc non Af Amer >90 >90 mL/min   GFR calc Af Amer >90 >90 mL/min   Anion gap 12 5 - 15    Treatments: IV hydration and antibiotics: ceftriaxone  Hospital Course:  This is a 29 y.o. [id number] with IUP at [redacted]w[redacted]d admitted for pyelonephritis. She was admitted with dysuria, pain, chills, fever. No leaking of fluid and no bleeding. She was started on IV abx, remained afebrile throughout her stay.  Urine culture grew out E. Coli. She was observed, fetal heart rate monitoring remained reassuring, and she had no labor or other maternal-fetal concerns.  She was deemed stable for  discharge to home with outpatient follow up. She will complete 14 days po abx--used 2nd generation cephalosporin, since pt. Reported blisters in mouth with Keflex.  She is too advanced for Bactrim. Will then transition to once daily Macrobid.  Discharge Exam: BP 91/48 mmHg  Pulse 83  Temp(Src) 97.5 F (36.4 C) (Oral)  Resp 18  Ht  (1.651 m)  Wt 138 lb 9.6 oz (62.869 kg)  BMI 23.06 kg/m2  SpO2 100%  LMP 07/04/2014 (Approximate) General appearance: alert, cooperative and appears stated age Back: symmetric, no curvature. ROM normal. No CVA tenderness. Resp: normal effort GI: gravid, NT Extremities: extremities normal, atraumatic, no cyanosis or edema FHR-115 baseline, minimal variability, non-reactive, contractions q 5 minutes, no decels.  Discharge Condition: Stable  Disposition: 01-Home or Self Care     Medication List    TAKE these medications        ALPRAZolam 1 MG tablet  Commonly known as:  XANAX  Take 1 mg by mouth at bedtime.     cefUROXime 250 MG tablet  Commonly known as:  CEFTIN  Take 1 tablet (250 mg total) by mouth 2 (two) times daily with a meal.     fluconazole 150 MG tablet  Commonly known as:  DIFLUCAN  Take 1 tablet (150 mg total) by mouth daily. Repeat in 24 hours if needed     HYDROcodone-acetaminophen 5-325 MG per tablet  Commonly known as:  NORCO/VICODIN  Take 1-2 tablets by mouth every 6 (six) hours as needed for moderate pain.     nitrofurantoin (macrocrystal-monohydrate) 100 MG capsule  Commonly known as:  MACROBID  Take 1 capsule (100 mg total) by mouth at bedtime.     pantoprazole 40 MG tablet  Commonly known as:  PROTONIX  Take 1 tablet (40 mg total) by mouth daily.     PRENATAL COMPLETE 14-0.4 MG Tabs  Take 1 tablet by mouth daily.     promethazine 25 MG tablet  Commonly known as:  PHENERGAN  Take 1 tablet (25 mg total) by mouth once.     QUEtiapine 100 MG tablet  Commonly known as:  SEROQUEL  Take 100 mg by mouth at  bedtime.  ranitidine 150 MG tablet  Commonly known as:  ZANTAC  Take 150 mg by mouth 2 (two) times daily as needed for heartburn.           Follow-up Information    [facility]   Specialty:  Obstetrics and Gynecology   Why:  keep next scheduled appointment   Contact information:   [address] [place] [id number] [phone]      Signed: Reva BoresRATT,Nathifa Ritthaler S M.D. 12/30/2014, 10:55 AM

## 2014-12-31 ENCOUNTER — Encounter: Payer: Self-pay | Admitting: Obstetrics & Gynecology

## 2014-12-31 LAB — URINE CULTURE

## 2015-01-01 ENCOUNTER — Encounter: Payer: Medicaid Other | Admitting: Obstetrics & Gynecology

## 2015-01-03 ENCOUNTER — Encounter: Payer: Medicaid Other | Admitting: Family Medicine

## 2015-01-03 ENCOUNTER — Ambulatory Visit (INDEPENDENT_AMBULATORY_CARE_PROVIDER_SITE_OTHER): Payer: Medicaid Other | Admitting: Family Medicine

## 2015-01-03 VITALS — BP 117/77 | HR 70 | Temp 97.5°F | Wt 145.6 lb

## 2015-01-03 DIAGNOSIS — O2303 Infections of kidney in pregnancy, third trimester: Secondary | ICD-10-CM

## 2015-01-03 DIAGNOSIS — N12 Tubulo-interstitial nephritis, not specified as acute or chronic: Secondary | ICD-10-CM

## 2015-01-03 DIAGNOSIS — O0993 Supervision of high risk pregnancy, unspecified, third trimester: Secondary | ICD-10-CM

## 2015-01-03 LAB — POCT URINALYSIS DIP (DEVICE)
Bilirubin Urine: NEGATIVE
GLUCOSE, UA: NEGATIVE mg/dL
HGB URINE DIPSTICK: NEGATIVE
Ketones, ur: NEGATIVE mg/dL
NITRITE: NEGATIVE
PROTEIN: NEGATIVE mg/dL
Specific Gravity, Urine: 1.02 (ref 1.005–1.030)
Urobilinogen, UA: 0.2 mg/dL (ref 0.0–1.0)
pH: 7.5 (ref 5.0–8.0)

## 2015-01-03 NOTE — Progress Notes (Signed)
Complains of continued back pain Recent hospitalization for pyelo--still on abx. Labor precautions

## 2015-01-03 NOTE — Progress Notes (Signed)
Trace leuks in urine.  

## 2015-01-03 NOTE — Patient Instructions (Signed)
Third Trimester of Pregnancy The third trimester is from week 29 through week 42, months 7 through 9. The third trimester is a time when the fetus is growing rapidly. At the end of the ninth month, the fetus is about 20 inches in length and weighs 6-10 pounds.  BODY CHANGES Your body goes through many changes during pregnancy. The changes vary from woman to woman.   Your weight will continue to increase. You can expect to gain 25-35 pounds (11-16 kg) by the end of the pregnancy.  You may begin to get stretch marks on your hips, abdomen, and breasts.  You may urinate more often because the fetus is moving lower into your pelvis and pressing on your bladder.  You may develop or continue to have heartburn as a result of your pregnancy.  You may develop constipation because certain hormones are causing the muscles that push waste through your intestines to slow down.  You may develop hemorrhoids or swollen, bulging veins (varicose veins).  You may have pelvic pain because of the weight gain and pregnancy hormones relaxing your joints between the bones in your pelvis. Backaches may result from overexertion of the muscles supporting your posture.  You may have changes in your hair. These can include thickening of your hair, rapid growth, and changes in texture. Some women also have hair loss during or after pregnancy, or hair that feels dry or thin. Your hair will most likely return to normal after your baby is born.  Your breasts will continue to grow and be tender. A yellow discharge may leak from your breasts called colostrum.  Your belly button may stick out.  You may feel short of breath because of your expanding uterus.  You may notice the fetus "dropping," or moving lower in your abdomen.  You may have a bloody mucus discharge. This usually occurs a few days to a week before labor begins.  Your cervix becomes thin and soft (effaced) near your due date. WHAT TO EXPECT AT YOUR  PRENATAL EXAMS  You will have prenatal exams every 2 weeks until week 36. Then, you will have weekly prenatal exams. During a routine prenatal visit:  You will be weighed to make sure you and the fetus are growing normally.  Your blood pressure is taken.  Your abdomen will be measured to track your baby's growth.  The fetal heartbeat will be listened to.  Any test results from the previous visit will be discussed.  You may have a cervical check near your due date to see if you have effaced. At around 36 weeks, your caregiver will check your cervix. At the same time, your caregiver will also perform a test on the secretions of the vaginal tissue. This test is to determine if a type of bacteria, Group B streptococcus, is present. Your caregiver will explain this further. Your caregiver may ask you:  What your birth plan is.  How you are feeling.  If you are feeling the baby move.  If you have had any abnormal symptoms, such as leaking fluid, bleeding, severe headaches, or abdominal cramping.  If you have any questions. Other tests or screenings that may be performed during your third trimester include:  Blood tests that check for low iron levels (anemia).  Fetal testing to check the health, activity level, and growth of the fetus. Testing is done if you have certain medical conditions or if there are problems during the pregnancy. FALSE LABOR You may feel small, irregular contractions that   eventually go away. These are called Braxton Hicks contractions, or false labor. Contractions may last for hours, days, or even weeks before true labor sets in. If contractions come at regular intervals, intensify, or become painful, it is best to be seen by your caregiver.  SIGNS OF LABOR   Menstrual-like cramps.  Contractions that are 5 minutes apart or less.  Contractions that start on the top of the uterus and spread down to the lower abdomen and back.  A sense of increased pelvic  pressure or back pain.  A watery or bloody mucus discharge that comes from the vagina. If you have any of these signs before the 37th week of pregnancy, call your caregiver right away. You need to go to the hospital to get checked immediately. HOME CARE INSTRUCTIONS   Avoid all smoking, herbs, alcohol, and unprescribed drugs. These chemicals affect the formation and growth of the baby.  Follow your caregiver's instructions regarding medicine use. There are medicines that are either safe or unsafe to take during pregnancy.  Exercise only as directed by your caregiver. Experiencing uterine cramps is a good sign to stop exercising.  Continue to eat regular, healthy meals.  Wear a good support bra for breast tenderness.  Do not use hot tubs, steam rooms, or saunas.  Wear your seat belt at all times when driving.  Avoid raw meat, uncooked cheese, cat litter boxes, and soil used by cats. These carry germs that can cause birth defects in the baby.  Take your prenatal vitamins.  Try taking a stool softener (if your caregiver approves) if you develop constipation. Eat more high-fiber foods, such as fresh vegetables or fruit and whole grains. Drink plenty of fluids to keep your urine clear or pale yellow.  Take warm sitz baths to soothe any pain or discomfort caused by hemorrhoids. Use hemorrhoid cream if your caregiver approves.  If you develop varicose veins, wear support hose. Elevate your feet for 15 minutes, 3-4 times a day. Limit salt in your diet.  Avoid heavy lifting, wear low heal shoes, and practice good posture.  Rest a lot with your legs elevated if you have leg cramps or low back pain.  Visit your dentist if you have not gone during your pregnancy. Use a soft toothbrush to brush your teeth and be gentle when you floss.  A sexual relationship may be continued unless your caregiver directs you otherwise.  Do not travel far distances unless it is absolutely necessary and only  with the approval of your caregiver.  Take prenatal classes to understand, practice, and ask questions about the labor and delivery.  Make a trial run to the hospital.  Pack your hospital bag.  Prepare the baby's nursery.  Continue to go to all your prenatal visits as directed by your caregiver. SEEK MEDICAL CARE IF:  You are unsure if you are in labor or if your water has broken.  You have dizziness.  You have mild pelvic cramps, pelvic pressure, or nagging pain in your abdominal area.  You have persistent nausea, vomiting, or diarrhea.  You have a bad smelling vaginal discharge.  You have pain with urination. SEEK IMMEDIATE MEDICAL CARE IF:   You have a fever.  You are leaking fluid from your vagina.  You have spotting or bleeding from your vagina.  You have severe abdominal cramping or pain.  You have rapid weight loss or gain.  You have shortness of breath with chest pain.  You notice sudden or extreme swelling   of your face, hands, ankles, feet, or legs.  You have not felt your baby move in over an hour.  You have severe headaches that do not go away with medicine.  You have vision changes. Document Released: 09/15/2001 Document Revised: 09/26/2013 Document Reviewed: 11/22/2012 [employer] Patient Information 2015 [employer]. This information is not intended to replace advice given to you by your health care provider. Make sure you discuss any questions you have with your health care provider.  Breastfeeding Deciding to breastfeed is one of the best choices you can make for you and your baby. A change in hormones during pregnancy causes your breast tissue to grow and increases the number and size of your milk ducts. These hormones also allow proteins, sugars, and fats from your blood supply to make breast milk in your milk-producing glands. Hormones prevent breast milk from being released before your baby is born as well as prompt milk flow after birth. Once  breastfeeding has begun, thoughts of your baby, as well as his or her sucking or crying, can stimulate the release of milk from your milk-producing glands.  BENEFITS OF BREASTFEEDING For Your Baby  Your first milk (colostrum) helps your baby's digestive system function better.   There are antibodies in your milk that help your baby fight off infections.   Your baby has a lower incidence of asthma, allergies, and sudden infant death syndrome.   The nutrients in breast milk are better for your baby than infant formulas and are designed uniquely for your baby's needs.   Breast milk improves your baby's brain development.   Your baby is less likely to develop other conditions, such as childhood obesity, asthma, or type 2 diabetes mellitus.  For You   Breastfeeding helps to create a very special bond between you and your baby.   Breastfeeding is convenient. Breast milk is always available at the correct temperature and costs nothing.   Breastfeeding helps to burn calories and helps you lose the weight gained during pregnancy.   Breastfeeding makes your uterus contract to its prepregnancy size faster and slows bleeding (lochia) after you give birth.   Breastfeeding helps to lower your risk of developing type 2 diabetes mellitus, osteoporosis, and breast or ovarian cancer later in life. SIGNS THAT YOUR BABY IS HUNGRY Early Signs of Hunger  Increased alertness or activity.  Stretching.  Movement of the head from side to side.  Movement of the head and opening of the mouth when the corner of the mouth or cheek is stroked (rooting).  Increased sucking sounds, smacking lips, cooing, sighing, or squeaking.  Hand-to-mouth movements.  Increased sucking of fingers or hands. Late Signs of Hunger  Fussing.  Intermittent crying. Extreme Signs of Hunger Signs of extreme hunger will require calming and consoling before your baby will be able to breastfeed successfully. Do not  wait for the following signs of extreme hunger to occur before you initiate breastfeeding:   Restlessness.  A loud, strong cry.   Screaming. BREASTFEEDING BASICS Breastfeeding Initiation  Find a comfortable place to sit or lie down, with your neck and back well supported.  Place a pillow or rolled up blanket under your baby to bring him or her to the level of your breast (if you are seated). Nursing pillows are specially designed to help support your arms and your baby while you breastfeed.  Make sure that your baby's abdomen is facing your abdomen.   Gently massage your breast. With your fingertips, massage from your chest   wall toward your nipple in a circular motion. This encourages milk flow. You may need to continue this action during the feeding if your milk flows slowly.  Support your breast with 4 fingers underneath and your thumb above your nipple. Make sure your fingers are well away from your nipple and your baby's mouth.   Stroke your baby's lips gently with your finger or nipple.   When your baby's mouth is open wide enough, quickly bring your baby to your breast, placing your entire nipple and as much of the colored area around your nipple (areola) as possible into your baby's mouth.   More areola should be visible above your baby's upper lip than below the lower lip.   Your baby's tongue should be between his or her lower gum and your breast.   Ensure that your baby's mouth is correctly positioned around your nipple (latched). Your baby's lips should create a seal on your breast and be turned out (everted).  It is common for your baby to suck about 2-3 minutes in order to start the flow of breast milk. Latching Teaching your baby how to latch on to your breast properly is very important. An improper latch can cause nipple pain and decreased milk supply for you and poor weight gain in your baby. Also, if your baby is not latched onto your nipple properly, he or she  may swallow some air during feeding. This can make your baby fussy. Burping your baby when you switch breasts during the feeding can help to get rid of the air. However, teaching your baby to latch on properly is still the best way to prevent fussiness from swallowing air while breastfeeding. Signs that your baby has successfully latched on to your nipple:    Silent tugging or silent sucking, without causing you pain.   Swallowing heard between every 3-4 sucks.    Muscle movement above and in front of his or her ears while sucking.  Signs that your baby has not successfully latched on to nipple:   Sucking sounds or smacking sounds from your baby while breastfeeding.  Nipple pain. If you think your baby has not latched on correctly, slip your finger into the corner of your baby's mouth to break the suction and place it between your baby's gums. Attempt breastfeeding initiation again. Signs of Successful Breastfeeding Signs from your baby:   A gradual decrease in the number of sucks or complete cessation of sucking.   Falling asleep.   Relaxation of his or her body.   Retention of a small amount of milk in his or her mouth.   Letting go of your breast by himself or herself. Signs from you:  Breasts that have increased in firmness, weight, and size 1-3 hours after feeding.   Breasts that are softer immediately after breastfeeding.  Increased milk volume, as well as a change in milk consistency and color by the fifth day of breastfeeding.   Nipples that are not sore, cracked, or bleeding. Signs That Your Baby is Getting Enough Milk  Wetting at least 3 diapers in a 24-hour period. The urine should be clear and pale yellow by age 5 days.  At least 3 stools in a 24-hour period by age 5 days. The stool should be soft and yellow.  At least 3 stools in a 24-hour period by age 7 days. The stool should be seedy and yellow.  No loss of weight greater than 10% of birth weight  during the first 3   days of age.  Average weight gain of 4-7 ounces (113-198 g) per week after age 4 days.  Consistent daily weight gain by age 5 days, without weight loss after the age of 2 weeks. After a feeding, your baby may spit up a small amount. This is common. BREASTFEEDING FREQUENCY AND DURATION Frequent feeding will help you make more milk and can prevent sore nipples and breast engorgement. Breastfeed when you feel the need to reduce the fullness of your breasts or when your baby shows signs of hunger. This is called "breastfeeding on demand." Avoid introducing a pacifier to your baby while you are working to establish breastfeeding (the first 4-6 weeks after your baby is born). After this time you may choose to use a pacifier. Research has shown that pacifier use during the first year of a baby's life decreases the risk of sudden infant death syndrome (SIDS). Allow your baby to feed on each breast as long as he or she wants. Breastfeed until your baby is finished feeding. When your baby unlatches or falls asleep while feeding from the first breast, offer the second breast. Because newborns are often sleepy in the first few weeks of life, you may need to awaken your baby to get him or her to feed. Breastfeeding times will vary from baby to baby. However, the following rules can serve as a guide to help you ensure that your baby is properly fed:  Newborns (babies 4 weeks of age or younger) may breastfeed every 1-3 hours.  Newborns should not go longer than 3 hours during the day or 5 hours during the night without breastfeeding.  You should breastfeed your baby a minimum of 8 times in a 24-hour period until you begin to introduce solid foods to your baby at around 6 months of age. BREAST MILK PUMPING Pumping and storing breast milk allows you to ensure that your baby is exclusively fed your breast milk, even at times when you are unable to breastfeed. This is especially important if you are  going back to work while you are still breastfeeding or when you are not able to be present during feedings. Your lactation consultant can give you guidelines on how long it is safe to store breast milk.  A breast pump is a machine that allows you to pump milk from your breast into a sterile bottle. The pumped breast milk can then be stored in a refrigerator or freezer. Some breast pumps are operated by hand, while others use electricity. Ask your lactation consultant which type will work best for you. Breast pumps can be purchased, but some hospitals and breastfeeding support groups lease breast pumps on a monthly basis. A lactation consultant can teach you how to hand express breast milk, if you prefer not to use a pump.  CARING FOR YOUR BREASTS WHILE YOU BREASTFEED Nipples can become dry, cracked, and sore while breastfeeding. The following recommendations can help keep your breasts moisturized and healthy:  Avoid using soap on your nipples.   Wear a supportive bra. Although not required, special nursing bras and tank tops are designed to allow access to your breasts for breastfeeding without taking off your entire bra or top. Avoid wearing underwire-style bras or extremely tight bras.  Air dry your nipples for 3-4minutes after each feeding.   Use only cotton bra pads to absorb leaked breast milk. Leaking of breast milk between feedings is normal.   Use lanolin on your nipples after breastfeeding. Lanolin helps to maintain your skin's   normal moisture barrier. If you use pure lanolin, you do not need to wash it off before feeding your baby again. Pure lanolin is not toxic to your baby. You may also hand express a few drops of breast milk and gently massage that milk into your nipples and allow the milk to air dry. In the first few weeks after giving birth, some women experience extremely full breasts (engorgement). Engorgement can make your breasts feel heavy, warm, and tender to the touch.  Engorgement peaks within 3-5 days after you give birth. The following recommendations can help ease engorgement:  Completely empty your breasts while breastfeeding or pumping. You may want to start by applying warm, moist heat (in the shower or with warm water-soaked hand towels) just before feeding or pumping. This increases circulation and helps the milk flow. If your baby does not completely empty your breasts while breastfeeding, pump any extra milk after he or she is finished.  Wear a snug bra (nursing or regular) or tank top for 1-2 days to signal your body to slightly decrease milk production.  Apply ice packs to your breasts, unless this is too uncomfortable for you.  Make sure that your baby is latched on and positioned properly while breastfeeding. If engorgement persists after 48 hours of following these recommendations, contact your health care provider or a lactation consultant. OVERALL HEALTH CARE RECOMMENDATIONS WHILE BREASTFEEDING  Eat healthy foods. Alternate between meals and snacks, eating 3 of each per day. Because what you eat affects your breast milk, some of the foods may make your baby more irritable than usual. Avoid eating these foods if you are sure that they are negatively affecting your baby.  Drink milk, fruit juice, and water to satisfy your thirst (about 10 glasses a day).   Rest often, relax, and continue to take your prenatal vitamins to prevent fatigue, stress, and anemia.  Continue breast self-awareness checks.  Avoid chewing and smoking tobacco.  Avoid alcohol and drug use. Some medicines that may be harmful to your baby can pass through breast milk. It is important to ask your health care provider before taking any medicine, including all over-the-counter and prescription medicine as well as vitamin and herbal supplements. It is possible to become pregnant while breastfeeding. If birth control is desired, ask your health care provider about options that  will be safe for your baby. SEEK MEDICAL CARE IF:   You feel like you want to stop breastfeeding or have become frustrated with breastfeeding.  You have painful breasts or nipples.  Your nipples are cracked or bleeding.  Your breasts are red, tender, or warm.  You have a swollen area on either breast.  You have a fever or chills.  You have nausea or vomiting.  You have drainage other than breast milk from your nipples.  Your breasts do not become full before feedings by the fifth day after you give birth.  You feel sad and depressed.  Your baby is too sleepy to eat well.  Your baby is having trouble sleeping.   Your baby is wetting less than 3 diapers in a 24-hour period.  Your baby has less than 3 stools in a 24-hour period.  Your baby's skin or the white part of his or her eyes becomes yellow.   Your baby is not gaining weight by 5 days of age. SEEK IMMEDIATE MEDICAL CARE IF:   Your baby is overly tired (lethargic) and does not want to wake up and feed.  Your baby   develops an unexplained fever. Document Released: 09/21/2005 Document Revised: 09/26/2013 Document Reviewed: 03/15/2013 [employer] Patient Information 2015 [employer]. This information is not intended to replace advice given to you by your health care provider. Make sure you discuss any questions you have with your health care provider.  

## 2015-01-07 ENCOUNTER — Encounter (HOSPITAL_COMMUNITY): Payer: Self-pay | Admitting: *Deleted

## 2015-01-07 ENCOUNTER — Inpatient Hospital Stay (HOSPITAL_COMMUNITY)
Admission: AD | Admit: 2015-01-07 | Discharge: 2015-01-09 | DRG: 775 | Disposition: A | Discharge status: Home / Self Care | Payer: Medicaid Other | Source: Ambulatory Visit | Attending: Obstetrics & Gynecology | Admitting: Obstetrics & Gynecology

## 2015-01-07 ENCOUNTER — Inpatient Hospital Stay (HOSPITAL_COMMUNITY): Payer: Medicaid Other | Admitting: Anesthesiology

## 2015-01-07 DIAGNOSIS — Z3A38 38 weeks gestation of pregnancy: Secondary | ICD-10-CM | POA: Diagnosis present

## 2015-01-07 DIAGNOSIS — Z833 Family history of diabetes mellitus: Secondary | ICD-10-CM

## 2015-01-07 DIAGNOSIS — J45909 Unspecified asthma, uncomplicated: Secondary | ICD-10-CM | POA: Diagnosis present

## 2015-01-07 DIAGNOSIS — Z8249 Family history of ischemic heart disease and other diseases of the circulatory system: Secondary | ICD-10-CM

## 2015-01-07 DIAGNOSIS — F319 Bipolar disorder, unspecified: Secondary | ICD-10-CM | POA: Diagnosis present

## 2015-01-07 DIAGNOSIS — O99824 Streptococcus B carrier state complicating childbirth: Secondary | ICD-10-CM | POA: Diagnosis present

## 2015-01-07 DIAGNOSIS — Z87891 Personal history of nicotine dependence: Secondary | ICD-10-CM

## 2015-01-07 DIAGNOSIS — K219 Gastro-esophageal reflux disease without esophagitis: Secondary | ICD-10-CM | POA: Diagnosis present

## 2015-01-07 DIAGNOSIS — O43199 Other malformation of placenta, unspecified trimester: Secondary | ICD-10-CM

## 2015-01-07 DIAGNOSIS — F909 Attention-deficit hyperactivity disorder, unspecified type: Secondary | ICD-10-CM | POA: Diagnosis present

## 2015-01-07 DIAGNOSIS — O9952 Diseases of the respiratory system complicating childbirth: Secondary | ICD-10-CM | POA: Diagnosis present

## 2015-01-07 DIAGNOSIS — O9962 Diseases of the digestive system complicating childbirth: Secondary | ICD-10-CM | POA: Diagnosis present

## 2015-01-07 DIAGNOSIS — F419 Anxiety disorder, unspecified: Secondary | ICD-10-CM | POA: Diagnosis present

## 2015-01-07 DIAGNOSIS — O99344 Other mental disorders complicating childbirth: Secondary | ICD-10-CM | POA: Diagnosis present

## 2015-01-07 DIAGNOSIS — O2303 Infections of kidney in pregnancy, third trimester: Secondary | ICD-10-CM

## 2015-01-07 DIAGNOSIS — O0993 Supervision of high risk pregnancy, unspecified, third trimester: Secondary | ICD-10-CM

## 2015-01-07 LAB — CBC
HEMATOCRIT: 34.8 % — AB (ref 36.0–46.0)
HEMOGLOBIN: 11.7 g/dL — AB (ref 12.0–15.0)
MCH: 31.6 pg (ref 26.0–34.0)
MCHC: 33.6 g/dL (ref 30.0–36.0)
MCV: 94.1 fL (ref 78.0–100.0)
Platelets: 560 10*3/uL — ABNORMAL HIGH (ref 150–400)
RBC: 3.7 MIL/uL — ABNORMAL LOW (ref 3.87–5.11)
RDW: 13.7 % (ref 11.5–15.5)
WBC: 25.8 10*3/uL — ABNORMAL HIGH (ref 4.0–10.5)

## 2015-01-07 LAB — TYPE AND SCREEN
ABO/RH(D): A POS
ANTIBODY SCREEN: NEGATIVE

## 2015-01-07 LAB — ABO/RH: ABO/RH(D): A POS

## 2015-01-07 MED ORDER — IBUPROFEN 600 MG PO TABS
600.0000 mg | ORAL_TABLET | Freq: Four times a day (QID) | ORAL | Status: DC
  Administered 2015-01-07 – 2015-01-09 (×8): 600 mg via ORAL
  Filled 2015-01-07 (×8): qty 1

## 2015-01-07 MED ORDER — CITRIC ACID-SODIUM CITRATE 334-500 MG/5ML PO SOLN: 30.0000 mL | ORAL | Status: DC | PRN

## 2015-01-07 MED ORDER — DIPHENHYDRAMINE HCL 50 MG/ML IJ SOLN: 12.5000 mg | INTRAMUSCULAR | Status: DC | PRN

## 2015-01-07 MED ORDER — DIPHENHYDRAMINE HCL 25 MG PO CAPS: 25.0000 mg | ORAL_CAPSULE | Freq: Four times a day (QID) | ORAL | Status: DC | PRN

## 2015-01-07 MED ORDER — BENZOCAINE-MENTHOL 20-0.5 % EX AERO: 1.0000 "application " | INHALATION_SPRAY | CUTANEOUS | Status: DC | PRN

## 2015-01-07 MED ORDER — WITCH HAZEL-GLYCERIN EX PADS: 1.0000 "application " | MEDICATED_PAD | CUTANEOUS | Status: DC | PRN

## 2015-01-07 MED ORDER — FENTANYL 2.5 MCG/ML BUPIVACAINE 1/10 % EPIDURAL INFUSION (WH - ANES)
14.0000 mL/h | INTRAMUSCULAR | Status: DC | PRN
  Filled 2015-01-07: qty 125

## 2015-01-07 MED ORDER — QUETIAPINE FUMARATE 200 MG PO TABS
100.0000 mg | ORAL_TABLET | Freq: Every day | ORAL | Status: DC
  Administered 2015-01-07 – 2015-01-08 (×2): 100 mg via ORAL
  Filled 2015-01-07 (×2): qty 0.5
  Filled 2015-01-07: qty 1

## 2015-01-07 MED ORDER — ACETAMINOPHEN 325 MG PO TABS: 650.0000 mg | ORAL_TABLET | ORAL | Status: DC | PRN

## 2015-01-07 MED ORDER — DIBUCAINE 1 % RE OINT: 1.0000 "application " | TOPICAL_OINTMENT | RECTAL | Status: DC | PRN

## 2015-01-07 MED ORDER — SODIUM CHLORIDE 0.9 % IV SOLN
2.0000 g | Freq: Once | INTRAVENOUS | Status: AC
  Administered 2015-01-07: 2 g via INTRAVENOUS
  Filled 2015-01-07: qty 2000

## 2015-01-07 MED ORDER — PHENYLEPHRINE 40 MCG/ML (10ML) SYRINGE FOR IV PUSH (FOR BLOOD PRESSURE SUPPORT)
80.0000 ug | PREFILLED_SYRINGE | INTRAVENOUS | Status: DC | PRN
  Filled 2015-01-07: qty 2

## 2015-01-07 MED ORDER — LANOLIN HYDROUS EX OINT: TOPICAL_OINTMENT | CUTANEOUS | Status: DC | PRN

## 2015-01-07 MED ORDER — ALPRAZOLAM 0.5 MG PO TABS
1.0000 mg | ORAL_TABLET | Freq: Every day | ORAL | Status: DC
  Administered 2015-01-07 – 2015-01-08 (×2): 1 mg via ORAL
  Filled 2015-01-07: qty 2

## 2015-01-07 MED ORDER — PHENYLEPHRINE 40 MCG/ML (10ML) SYRINGE FOR IV PUSH (FOR BLOOD PRESSURE SUPPORT)
80.0000 ug | PREFILLED_SYRINGE | INTRAVENOUS | Status: DC | PRN
  Filled 2015-01-07: qty 2
  Filled 2015-01-07: qty 20

## 2015-01-07 MED ORDER — ZOLPIDEM TARTRATE 5 MG PO TABS: 5.0000 mg | ORAL_TABLET | Freq: Every evening | ORAL | Status: DC | PRN

## 2015-01-07 MED ORDER — OXYCODONE-ACETAMINOPHEN 5-325 MG PO TABS
2.0000 | ORAL_TABLET | ORAL | Status: DC | PRN
  Administered 2015-01-08 – 2015-01-09 (×4): 2 via ORAL
  Filled 2015-01-07 (×5): qty 2

## 2015-01-07 MED ORDER — OXYCODONE-ACETAMINOPHEN 5-325 MG PO TABS: 1.0000 | ORAL_TABLET | ORAL | Status: DC | PRN

## 2015-01-07 MED ORDER — EPHEDRINE 5 MG/ML INJ
10.0000 mg | INTRAVENOUS | Status: DC | PRN
  Filled 2015-01-07: qty 2

## 2015-01-07 MED ORDER — SENNOSIDES-DOCUSATE SODIUM 8.6-50 MG PO TABS
2.0000 | ORAL_TABLET | ORAL | Status: DC
  Administered 2015-01-07 – 2015-01-09 (×2): 2 via ORAL
  Filled 2015-01-07 (×2): qty 2

## 2015-01-07 MED ORDER — SIMETHICONE 80 MG PO CHEW: 80.0000 mg | CHEWABLE_TABLET | ORAL | Status: DC | PRN

## 2015-01-07 MED ORDER — ACETAMINOPHEN 325 MG PO TABS
650.0000 mg | ORAL_TABLET | ORAL | Status: DC | PRN
  Filled 2015-01-07: qty 2

## 2015-01-07 MED ORDER — OXYTOCIN BOLUS FROM INFUSION: 500.0000 mL | INTRAVENOUS | Status: DC

## 2015-01-07 MED ORDER — FLEET ENEMA 7-19 GM/118ML RE ENEM: 1.0000 | ENEMA | RECTAL | Status: DC | PRN

## 2015-01-07 MED ORDER — ONDANSETRON HCL 4 MG PO TABS: 4.0000 mg | ORAL_TABLET | ORAL | Status: DC | PRN

## 2015-01-07 MED ORDER — OXYTOCIN 40 UNITS IN LACTATED RINGERS INFUSION - SIMPLE MED
62.5000 mL/h | INTRAVENOUS | Status: DC
  Administered 2015-01-07: 62.5 mL/h via INTRAVENOUS
  Filled 2015-01-07 (×2): qty 1000

## 2015-01-07 MED ORDER — LACTATED RINGERS IV SOLN
500.0000 mL | INTRAVENOUS | Status: DC | PRN
Start: 2015-01-07 — End: 2015-01-07

## 2015-01-07 MED ORDER — ONDANSETRON HCL 4 MG/2ML IJ SOLN: 4.0000 mg | INTRAMUSCULAR | Status: DC | PRN

## 2015-01-07 MED ORDER — PRENATAL MULTIVITAMIN CH
1.0000 | ORAL_TABLET | Freq: Every day | ORAL | Status: DC
  Administered 2015-01-08: 1 via ORAL
  Filled 2015-01-07 (×2): qty 1

## 2015-01-07 MED ORDER — TETANUS-DIPHTH-ACELL PERTUSSIS 5-2.5-18.5 LF-MCG/0.5 IM SUSP: 0.5000 mL | Freq: Once | INTRAMUSCULAR | Status: DC

## 2015-01-07 MED ORDER — ONDANSETRON HCL 4 MG/2ML IJ SOLN: 4.0000 mg | Freq: Four times a day (QID) | INTRAMUSCULAR | Status: DC | PRN

## 2015-01-07 MED ORDER — OXYCODONE-ACETAMINOPHEN 5-325 MG PO TABS
1.0000 | ORAL_TABLET | ORAL | Status: DC | PRN
  Administered 2015-01-07 – 2015-01-09 (×7): 1 via ORAL
  Filled 2015-01-07 (×6): qty 1

## 2015-01-07 MED ORDER — OXYCODONE-ACETAMINOPHEN 5-325 MG PO TABS
2.0000 | ORAL_TABLET | ORAL | Status: DC | PRN
  Administered 2015-01-07: 2 via ORAL
  Filled 2015-01-07: qty 2

## 2015-01-07 MED ORDER — CEFUROXIME AXETIL 250 MG PO TABS
250.0000 mg | ORAL_TABLET | Freq: Two times a day (BID) | ORAL | Status: DC
  Administered 2015-01-07 – 2015-01-09 (×4): 250 mg via ORAL
  Filled 2015-01-07 (×6): qty 1

## 2015-01-07 MED ORDER — LACTATED RINGERS IV SOLN
500.0000 mL | Freq: Once | INTRAVENOUS | Status: AC
  Administered 2015-01-07: 500 mL via INTRAVENOUS

## 2015-01-07 MED ORDER — LIDOCAINE HCL (PF) 1 % IJ SOLN
30.0000 mL | INTRAMUSCULAR | Status: DC | PRN
  Administered 2015-01-07: 30 mL via SUBCUTANEOUS
  Filled 2015-01-07 (×2): qty 30

## 2015-01-07 MED ORDER — LACTATED RINGERS IV SOLN
INTRAVENOUS | Status: DC
Start: 2015-01-07 — End: 2015-01-07
  Administered 2015-01-07: 13:00:00 via INTRAVENOUS

## 2015-01-07 MED ORDER — FENTANYL CITRATE 0.05 MG/ML IJ SOLN
100.0000 ug | Freq: Once | INTRAMUSCULAR | Status: AC
  Administered 2015-01-07: 100 ug via INTRAVENOUS
  Filled 2015-01-07: qty 2
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
## 2015-01-07 NOTE — Anesthesia Postprocedure Evaluation (Signed)
Anesthesia Post Note  Patient: Caitlin CharAbigail O Berger  Procedure(s) Performed: * No procedures listed *  Anesthesia type: Epidural  Patient location: Mother/Baby  Post pain: Pain level controlled  Post assessment: Post-op Vital signs reviewed  Last Vitals:  Filed Vitals:   01/07/15 1315  BP: 117/71  Pulse: 68  Temp: 36.6 C  Resp: 20    Post vital signs: Reviewed  Level of consciousness: awake  Complications: No apparent anesthesia complications. Epidural not performed.

## 2015-01-07 NOTE — Anesthesia Preprocedure Evaluation (Signed)
Anesthesia Evaluation  Patient identified by MRN, date of birth, ID band Patient awake    Reviewed: Allergy & Precautions, H&P , Patient's Chart, lab work & pertinent test results  Airway Mallampati: II  TM Distance: >3 FB Neck ROM: full    Dental no notable dental hx.    Pulmonary former smoker,  breath sounds clear to auscultation  Pulmonary exam normal       Cardiovascular negative cardio ROS  Rhythm:regular Rate:Normal     Neuro/Psych negative psych ROS   GI/Hepatic negative GI ROS, Neg liver ROS,   Endo/Other  negative endocrine ROS  Renal/GU negative Renal ROS     Musculoskeletal   Abdominal   Peds  Hematology negative hematology ROS (+)   Anesthesia Other Findings   Reproductive/Obstetrics (+) Pregnancy                             Anesthesia Physical Anesthesia Plan  ASA: II  Anesthesia Plan: Epidural   Post-op Pain Management:    Induction:   Airway Management Planned:   Additional Equipment:   Intra-op Plan:   Post-operative Plan:   Informed Consent: I have reviewed the patients History and Physical, chart, labs and discussed the procedure including the risks, benefits and alternatives for the proposed anesthesia with the patient or authorized representative who has indicated his/her understanding and acceptance.     Plan Discussed with:   Anesthesia Plan Comments:         Anesthesia Quick Evaluation

## 2015-01-07 NOTE — MAU Note (Signed)
[facility] pt to go to room [facility].

## 2015-01-07 NOTE — Progress Notes (Signed)
Delivery Note At 1420  a viable female was delivered via NSVD (Presentation: occiput posterior ; meconium stained fluid  ).  APGAR: 7 , 9 ; weight 2830g  .   Placenta status: intact.  Cord: 3 vessel with the following complications: loose nuchal x1.   Anesthesia:  none Episiotomy:  none Lacerations:   small 1st degree labial Suture Repair: none Est. Blood Loss (mL): 100cc    Mom to postpartum.  Baby to Couplet care / Skin to Skin.  Delynn FlavinGottschalk, Ariyanah Aguado M, DO 01/07/2015, 3:31 PM

## 2015-01-08 LAB — CBC
HCT: 31.4 % — ABNORMAL LOW (ref 36.0–46.0)
Hemoglobin: 10.6 g/dL — ABNORMAL LOW (ref 12.0–15.0)
MCH: 31.6 pg (ref 26.0–34.0)
MCHC: 33.8 g/dL (ref 30.0–36.0)
MCV: 93.7 fL (ref 78.0–100.0)
PLATELETS: 498 10*3/uL — AB (ref 150–400)
RBC: 3.35 MIL/uL — ABNORMAL LOW (ref 3.87–5.11)
RDW: 13.8 % (ref 11.5–15.5)
WBC: 22.2 10*3/uL — AB (ref 4.0–10.5)

## 2015-01-08 LAB — RPR: RPR: NONREACTIVE

## 2015-01-08 NOTE — Progress Notes (Signed)
Post Partum Day 1 Subjective:  Caitlin Berger is a 29 y.o. [id number] 1822w6d s/p NSVD.  No acute events overnight.  Pt denies problems with ambulating, voiding or po intake.  She denies nausea or vomiting.  Pain is well controlled when she takes her pain medication.  She has had flatus. She has had bowel movement.  Lochia Small.  Plan for birth control is bilateral tubal ligation.  Method of Feeding:  Bottle.  She reports that her baby went to the NICU yesterday for increased RR.  Objective: Blood pressure 123/59, pulse 60, temperature 97.8 F (36.6 C), temperature source Oral, resp. rate 18, height 5\' 5"  (1.651 m), weight 145 lb (65.772 kg), last menstrual period 07/04/2014, SpO2 99 %, unknown if currently breastfeeding.  Physical Exam:  General: alert, cooperative and no distress Lochia:normal flow Chest: CTAB, no increased WOB Heart: RRR no m/r/g Abdomen: +BS, soft, nontender Uterine Fundus: firm, 2 fingerbreaths below the umbilicus  DVT Evaluation: No evidence of DVT seen on physical exam. Extremities: WWP, no edema  Recent Labs  01/07/15 1309 01/08/15 0540  HGB 11.7* 10.6*  HCT 34.8* 31.4*   CBC    Component Value Date/Time   WBC 22.2* 01/08/2015 0540   RBC 3.35* 01/08/2015 0540   HGB 10.6* 01/08/2015 0540   HCT 31.4* 01/08/2015 0540   PLT 498* 01/08/2015 0540   MCV 93.7 01/08/2015 0540   MCH 31.6 01/08/2015 0540   MCHC 33.8 01/08/2015 0540   RDW 13.8 01/08/2015 0540   LYMPHSABS 2.5 12/29/2014 0040   MONOABS 2.5* 12/29/2014 0040   EOSABS 0.0 12/29/2014 0040   BASOSABS 0.0 12/29/2014 0040    Assessment/Plan:  ASSESSMENT: Caitlin Berger is a 29 y.o. [id number] 4022w6d s/p NSVD  Plan for discharge tomorrow and Social Work consult.  Will obtain circ outpatient.  Needs to fill out paperwork for BTL.     LOS: 1 day   Raliegh IpGottschalk, Ashly M, DO 01/08/2015, 7:31 AM    OB fellow attestation Post Partum Day 1 I have seen and examined this patient and agree with above  documentation in the resident's note.   Caitlin Berger is a 29 y.o. [id number] s/p NSVD.  Pt denies problems with ambulating, voiding or po intake. Pain is well controlled.  Plan for birth control is bilateral tubal ligation.  Method of Feeding: bottle  PE:  BP 123/59 mmHg  Pulse 60  Temp(Src) 97.8 F (36.6 C) (Oral)  Resp 18  Ht 5\' 5"  (1.651 m)  Wt 145 lb (65.772 kg)  BMI 24.13 kg/m2  SpO2 99%  LMP 07/04/2014 (Approximate)  Breastfeeding? Unknown Fundus firm  Plan for discharge: PPD#2  William DaltonMcEachern, Avian [place], MD 7:37 AM

## 2015-01-08 NOTE — Progress Notes (Signed)
CSW attempted to meet with MOB, but she was not in her room at this time.  CSW will attempt again at a later time. 

## 2015-01-09 ENCOUNTER — Encounter: Payer: Self-pay | Admitting: *Deleted
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
## 2015-01-09 NOTE — Progress Notes (Signed)
Post Partum Day 2 Subjective:  Caitlin Berger is a 29 y.o. [id number] 6640w6d s/p NSVD.  No acute events overnight.  Pt denies problems with ambulating, voiding or po intake.  She denies nausea or vomiting.  Pain is well controlled when she takes her pain medication.  She has had flatus. She has had bowel movement.  Lochia Small.  Plan for birth control is bilateral tubal ligation.  Method of Feeding:  Bottle.  She reports that her baby went to the NICU for increased RR/ poor feeding.  Objective: Blood pressure 106/54, pulse 65, temperature 98.5 F (36.9 C), temperature source Oral, resp. rate 18, height 5\' 5"  (1.651 m), weight 145 lb (65.772 kg), last menstrual period 07/04/2014, SpO2 99 %, unknown if currently breastfeeding.  Physical Exam:  General: alert, cooperative and no distress Lochia:normal flow Chest: CTAB, no increased WOB Heart: RRR no m/r/g Abdomen: +BS, soft, nontender Uterine Fundus: firm, 3-4 fingerbreaths below the umbilicus  DVT Evaluation: No evidence of DVT seen on physical exam. Extremities: WWP, no edema  Recent Labs  01/07/15 1309 01/08/15 0540  HGB 11.7* 10.6*  HCT 34.8* 31.4*   Assessment/Plan:  ASSESSMENT: Caitlin Berger is a 29 y.o. [id number] 6040w6d s/p NSVD  Discharge once evaluated by Social Work.  Will need room in order, as baby is still in NICU. Contraception BTL.  Will obtain circ outpatient.  Needs to fill out paperwork for BTL.     LOS: 2 days   Raliegh IpGottschalk, Ashly M, DO 01/09/2015, 9:45 AM

## 2015-01-09 NOTE — Discharge Instructions (Signed)
You have elected for a tubal ligation for birth control.  Follow up with your doctor in 2 weeks for pre-op.  Non-Breastfeeding Wear a well-fitting bra for support.  Use ice packs to relieve discomfort from engorgement.  Avoid handling your breasts and do not express milk.  Non-breastfeeding engorgement will subside in 24-36 hours. Uterine Changes After pains, or cramping, are normal. This cramping means that the uterus is contracting to return to its non-pregnant size. The uterus takes 5-6 weeks to return to its non-pregnant size. Vaginal Discharge Usually lasts about 10 days to 4 weeks. The color will change from bright red to brownish to tan and will become less in amount and finally disappear.  Menstruation: your period will resume in approximately 6-8 weeks, unless breastfeeding. Activity Rest! Do not do heavy housework or heavy exercise for two weeks. Avoid driving for 1-2 weeks. Check with your doctor for limitations on activities if you have had a C-Section.  Avoid sexual activity, douching or tampons until your postpartum visit.  Vaginal Delivery, Care After Refer to this sheet in the next few weeks. These discharge instructions provide you with information on caring for yourself after delivery. Your caregiver may also give you specific instructions. Your treatment has been planned according to the most current medical practices available, but problems sometimes occur. Call your caregiver if you have any problems or questions after you go home. HOME CARE INSTRUCTIONS  Take over-the-counter or prescription medicines only as directed by your caregiver or pharmacist.  Do not drink alcohol, especially if you are breastfeeding or taking medicine to relieve pain.  Do not chew or smoke tobacco.  Do not use illegal drugs.  Continue to use good perineal care. Good perineal care includes:  Wiping your perineum from front to back.  Keeping your perineum clean.  Do not use tampons or  douche until your caregiver says it is okay.  Shower, wash your hair, and take tub baths as directed by your caregiver.  Wear a well-fitting bra that provides breast support.  Eat healthy foods.  Drink enough fluids to keep your urine clear or pale yellow.  Eat high-fiber foods such as whole grain cereals and breads, brown rice, beans, and fresh fruits and vegetables every day. These foods may help prevent or relieve constipation.  Follow your caregiver's recommendations regarding resumption of activities such as climbing stairs, driving, lifting, exercising, or traveling.  Talk to your caregiver about resuming sexual activities. Resumption of sexual activities is dependent upon your risk of infection, your rate of healing, and your comfort and desire to resume sexual activity.  Try to have someone help you with your household activities and your newborn for at least a few days after you leave the hospital.  Rest as much as possible. Try to rest or take a nap when your newborn is sleeping.  Increase your activities gradually.  Keep all of your scheduled postpartum appointments. It is very important to keep your scheduled follow-up appointments. At these appointments, your caregiver will be checking to make sure that you are healing physically and emotionally. SEEK MEDICAL CARE IF:   You are passing large clots from your vagina. Save any clots to show your caregiver.  You have a foul smelling discharge from your vagina.  You have trouble urinating.  You are urinating frequently.  You have pain when you urinate.  You have a change in your bowel movements.  You have increasing redness, pain, or swelling near your vaginal incision (episiotomy) or vaginal  tear.  You have pus draining from your episiotomy or vaginal tear.  Your episiotomy or vaginal tear is separating.  You have painful, hard, or reddened breasts.  You have a severe headache.  You have blurred vision or see  spots.  You feel sad or depressed.  You have thoughts of hurting yourself or your newborn.  You have questions about your care, the care of your newborn, or medicines.  You are dizzy or light-headed.  You have a rash.  You have nausea or vomiting.  You were breastfeeding and have not had a menstrual period within 12 weeks after you stopped breastfeeding.  You are not breastfeeding and have not had a menstrual period by the 12th week after delivery.  You have a fever. SEEK IMMEDIATE MEDICAL CARE IF:   You have persistent pain.  You have chest pain.  You have shortness of breath.  You faint.  You have leg pain.  You have stomach pain.  Your vaginal bleeding saturates two or more sanitary pads in 1 hour. MAKE SURE YOU:   Understand these instructions.  Will watch your condition.  Will get help right away if you are not doing well or get worse. Document Released: 09/18/2000 Document Revised: 02/05/2014 Document Reviewed: 05/18/2012 [facility] Patient Information 2015 [place], [place]. This information is not intended to replace advice given to you by your health care provider. Make sure you discuss any questions you have with your health care provider.

## 2015-01-10 ENCOUNTER — Encounter: Payer: Medicaid Other | Admitting: Obstetrics and Gynecology

## 2015-02-14 ENCOUNTER — Telehealth: Payer: Self-pay

## 2015-02-14 ENCOUNTER — Ambulatory Visit: Payer: Medicaid Other | Admitting: Obstetrics and Gynecology

## 2015-02-14 NOTE — Telephone Encounter (Signed)
Patient missed PP visit. Attempted to contact patient. No answer. Left message stating we are sorry you missed your appointment,please call clinic to reschedule. Called home number-- left message with mother to have patient call to reschedule.

## 2015-02-25 ENCOUNTER — Encounter: Payer: Self-pay | Admitting: Family Medicine

## 2015-02-25 ENCOUNTER — Ambulatory Visit (INDEPENDENT_AMBULATORY_CARE_PROVIDER_SITE_OTHER): Payer: Medicaid Other | Admitting: Family Medicine

## 2015-02-25 DIAGNOSIS — Z30011 Encounter for initial prescription of contraceptive pills: Secondary | ICD-10-CM

## 2015-02-25 LAB — POCT URINALYSIS DIP (DEVICE)
BILIRUBIN URINE: NEGATIVE
Glucose, UA: NEGATIVE mg/dL
Hgb urine dipstick: NEGATIVE
Ketones, ur: NEGATIVE mg/dL
Nitrite: NEGATIVE
PH: 7 (ref 5.0–8.0)
PROTEIN: NEGATIVE mg/dL
Specific Gravity, Urine: 1.02 (ref 1.005–1.030)
UROBILINOGEN UA: 0.2 mg/dL (ref 0.0–1.0)

## 2015-02-25 LAB — POCT PREGNANCY, URINE: Preg Test, Ur: NEGATIVE

## 2015-02-25 MED ORDER — NORGESTIMATE-ETH ESTRADIOL 0.25-35 MG-MCG PO TABS: 1.0000 | ORAL_TABLET | Freq: Every day | ORAL | Status: DC

## 2015-02-25 NOTE — Progress Notes (Signed)
Patient ID: Caitlin Berger, female   DOB: 09/24/1986, 29 y.o.   MRN: [id number] Subjective:     Caitlin Charbigail O Yanko is a 29 y.o. female who presents for a postpartum visit. She is 7 weeks postpartum following a spontaneous vaginal delivery. I have fully reviewed the prenatal and intrapartum course. The delivery was at 38 gestational weeks. Outcome: spontaneous vaginal delivery. Anesthesia: none. Postpartum course has been remarakable. Baby's course has been good. Baby is feeding by bottle - Similac Sensitive RS. Bleeding no bleeding. Bowel function is normal. Bladder function is normal. Patient is sexually active. Contraception method is tubal ligation. Postpartum depression screening: negative.  Pt desires Tubal Ligation, request birth control until surgery date.  The following portions of the patient's history were reviewed and updated as appropriate: allergies, current medications, past family history, past medical history, past social history, past surgical history and problem list.  Review of Systems A comprehensive review of systems was negative.   Objective:    BP 123/81 mmHg  Pulse 100  Temp(Src) 97.6 F (36.4 C)  Wt 116 lb 12.8 oz (52.98 kg)  Breastfeeding? No  General:  alert, cooperative and appears stated age  Lungs: clear to auscultation bilaterally  Heart:  regular rate and rhythm, S1, S2 normal, no murmur, click, rub or gallop  Abdomen: soft, non-tender; bowel sounds normal; no masses,  no organomegaly   Vulva:  normal  Vagina: normal vagina, no discharge, exudate, lesion, or erythema  Cervix:  multiparous appearance, no cervical motion tenderness and no lesions  Corpus: normal size, contour, position, consistency, mobility, non-tender  Adnexa:  normal adnexa       UPT: negative Assessment:     Normal  postpartum exam. Pap smear not done at today's visit.   Plan:    1. Contraception: OCP (estrogen/progesterone) begin today--rx sent in until  BTL--salpingectomy--scheduled today Risks include but are not limited to bleeding, infection, injury to surrounding structures, including bowel, bladder and ureters, blood clots, and death.  Likelihood of success is high.  2. No pp depression 3. Follow up in: 3 months or as needed.   4. Pap due 2019.

## 2015-03-03 DIAGNOSIS — Z302 Encounter for sterilization: Secondary | ICD-10-CM
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
## 2015-03-20 ENCOUNTER — Encounter (HOSPITAL_COMMUNITY): Payer: Self-pay

## 2015-03-20 ENCOUNTER — Encounter (HOSPITAL_COMMUNITY)
Admission: RE | Admit: 2015-03-20 | Discharge: 2015-03-20 | Disposition: A | Discharge status: Home / Self Care | Payer: Medicaid Other | Source: Ambulatory Visit | Attending: Family Medicine | Admitting: Family Medicine

## 2015-03-20 DIAGNOSIS — Z01818 Encounter for other preprocedural examination: Secondary | ICD-10-CM | POA: Insufficient documentation

## 2015-03-20 HISTORY — DX: Gastro-esophageal reflux disease without esophagitis: K21.9

## 2015-03-20 LAB — CBC
HCT: 38.3 % (ref 36.0–46.0)
Hemoglobin: 13.4 g/dL (ref 12.0–15.0)
MCH: 31.1 pg (ref 26.0–34.0)
MCHC: 35 g/dL (ref 30.0–36.0)
MCV: 88.9 fL (ref 78.0–100.0)
Platelets: 372 10*3/uL (ref 150–400)
RBC: 4.31 MIL/uL (ref 3.87–5.11)
RDW: 13.6 % (ref 11.5–15.5)
WBC: 11.8 10*3/uL — AB (ref 4.0–10.5)

## 2015-03-20 LAB — TYPE AND SCREEN
ABO/RH(D): A POS
ANTIBODY SCREEN: NEGATIVE

## 2015-03-20 NOTE — Patient Instructions (Signed)
Your procedure is scheduled on:  March 27, 2015  Enter through the Main Entrance of [facility] at:  12:10 pm   Pick up the phone at the desk and dial [phone].  Call this number if you have problems the morning of surgery: [phone].  Remember: Do NOT eat food:  Midnight on March 26, 2015 Do NOT drink clear liquids after: 0930 am day of surgery   Take these medicines the morning of surgery with a SIP OF WATER:  None   Do NOT wear jewelry (body piercing), metal hair clips/bobby pins, make-up, or nail polish. Do NOT wear lotions, powders, or perfumes.  You may wear deoderant. Do NOT shave for 48 hours prior to surgery. Do NOT bring valuables to the hospital. Contacts, dentures, or bridgework may not be worn into surgery. Have a responsible adult drive you home and stay with you for 24 hours after your procedure

## 2015-03-22 ENCOUNTER — Ambulatory Visit: Payer: Medicaid Other | Admitting: Obstetrics & Gynecology

## 2015-03-27 ENCOUNTER — Ambulatory Visit (HOSPITAL_COMMUNITY): Payer: Medicaid Other | Admitting: Anesthesiology

## 2015-03-27 ENCOUNTER — Encounter (HOSPITAL_COMMUNITY)
Admission: RE | Disposition: A | Discharge status: Home / Self Care | Payer: Self-pay | Source: Ambulatory Visit | Attending: Family Medicine

## 2015-03-27 ENCOUNTER — Ambulatory Visit (HOSPITAL_COMMUNITY)
Admission: RE | Admit: 2015-03-27 | Discharge: 2015-03-27 | Disposition: A | Discharge status: Home / Self Care | Payer: Medicaid Other | Source: Ambulatory Visit | Attending: Family Medicine | Admitting: Family Medicine

## 2015-03-27 DIAGNOSIS — J45909 Unspecified asthma, uncomplicated: Secondary | ICD-10-CM | POA: Diagnosis not present

## 2015-03-27 DIAGNOSIS — Z79899 Other long term (current) drug therapy: Secondary | ICD-10-CM | POA: Diagnosis not present

## 2015-03-27 DIAGNOSIS — F909 Attention-deficit hyperactivity disorder, unspecified type: Secondary | ICD-10-CM | POA: Diagnosis not present

## 2015-03-27 DIAGNOSIS — Z87891 Personal history of nicotine dependence: Secondary | ICD-10-CM | POA: Insufficient documentation

## 2015-03-27 DIAGNOSIS — Z302 Encounter for sterilization: Secondary | ICD-10-CM | POA: Diagnosis not present

## 2015-03-27 DIAGNOSIS — Z793 Long term (current) use of hormonal contraceptives: Secondary | ICD-10-CM | POA: Insufficient documentation

## 2015-03-27 DIAGNOSIS — F319 Bipolar disorder, unspecified: Secondary | ICD-10-CM | POA: Diagnosis not present

## 2015-03-27 DIAGNOSIS — K219 Gastro-esophageal reflux disease without esophagitis: Secondary | ICD-10-CM | POA: Diagnosis not present

## 2015-03-27 DIAGNOSIS — F419 Anxiety disorder, unspecified: Secondary | ICD-10-CM | POA: Insufficient documentation

## 2015-03-27 DIAGNOSIS — G43909 Migraine, unspecified, not intractable, without status migrainosus: Secondary | ICD-10-CM | POA: Diagnosis not present

## 2015-03-27 HISTORY — PX: LAPAROSCOPIC BILATERAL SALPINGECTOMY: SHX5889

## 2015-03-27 LAB — PREGNANCY, URINE: Preg Test, Ur: NEGATIVE

## 2015-03-27 SURGERY — SALPINGECTOMY, BILATERAL, LAPAROSCOPIC
Anesthesia: General | Site: Abdomen | Laterality: Bilateral

## 2015-03-27 MED ORDER — ROCURONIUM BROMIDE 100 MG/10ML IV SOLN
INTRAVENOUS | Status: AC
  Filled 2015-03-27: qty 1

## 2015-03-27 MED ORDER — DEXAMETHASONE SODIUM PHOSPHATE 10 MG/ML IJ SOLN
INTRAMUSCULAR | Status: DC | PRN
  Administered 2015-03-27: 4 mg via INTRAVENOUS

## 2015-03-27 MED ORDER — ROCURONIUM BROMIDE 100 MG/10ML IV SOLN
INTRAVENOUS | Status: DC | PRN
  Administered 2015-03-27: 10 mg via INTRAVENOUS
  Administered 2015-03-27: 30 mg via INTRAVENOUS

## 2015-03-27 MED ORDER — OXYCODONE-ACETAMINOPHEN 5-325 MG PO TABS: 1.0000 | ORAL_TABLET | Freq: Four times a day (QID) | ORAL | Status: DC | PRN

## 2015-03-27 MED ORDER — BUPIVACAINE HCL (PF) 0.25 % IJ SOLN
INTRAMUSCULAR | Status: AC
  Filled 2015-03-27: qty 30

## 2015-03-27 MED ORDER — KETOROLAC TROMETHAMINE 30 MG/ML IJ SOLN
INTRAMUSCULAR | Status: AC
Start: 2015-03-27 — End: 2015-03-27
  Filled 2015-03-27: qty 1

## 2015-03-27 MED ORDER — ONDANSETRON HCL 4 MG/2ML IJ SOLN
INTRAMUSCULAR | Status: AC
  Filled 2015-03-27: qty 2

## 2015-03-27 MED ORDER — GLYCOPYRROLATE 0.2 MG/ML IJ SOLN
INTRAMUSCULAR | Status: DC | PRN
  Administered 2015-03-27: 0.4 mg via INTRAVENOUS

## 2015-03-27 MED ORDER — OXYCODONE HCL 5 MG PO TABS
5.0000 mg | ORAL_TABLET | Freq: Once | ORAL | Status: AC
  Administered 2015-03-27: 5 mg via ORAL

## 2015-03-27 MED ORDER — SCOPOLAMINE 1 MG/3DAYS TD PT72
1.0000 | MEDICATED_PATCH | Freq: Once | TRANSDERMAL | Status: DC
Start: 2015-03-27 — End: 2015-03-27

## 2015-03-27 MED ORDER — LACTATED RINGERS IV SOLN
INTRAVENOUS | Status: DC | PRN
  Administered 2015-03-27: 14:00:00 via INTRAVENOUS

## 2015-03-27 MED ORDER — NEOSTIGMINE METHYLSULFATE 10 MG/10ML IV SOLN
INTRAVENOUS | Status: DC | PRN
  Administered 2015-03-27: 3 mg via INTRAVENOUS

## 2015-03-27 MED ORDER — OXYCODONE HCL 5 MG PO TABS
ORAL_TABLET | ORAL | Status: AC
  Filled 2015-03-27: qty 1

## 2015-03-27 MED ORDER — MIDAZOLAM HCL 2 MG/2ML IJ SOLN
INTRAMUSCULAR | Status: AC
  Filled 2015-03-27: qty 2

## 2015-03-27 MED ORDER — HYDROMORPHONE HCL 1 MG/ML IJ SOLN
INTRAMUSCULAR | Status: AC
  Administered 2015-03-27: 0.5 mg via INTRAVENOUS
  Filled 2015-03-27: qty 1

## 2015-03-27 MED ORDER — KETOROLAC TROMETHAMINE 30 MG/ML IJ SOLN
30.0000 mg | Freq: Once | INTRAMUSCULAR | Status: AC
  Administered 2015-03-27: 30 mg via INTRAMUSCULAR

## 2015-03-27 MED ORDER — FENTANYL CITRATE (PF) 100 MCG/2ML IJ SOLN: 25.0000 ug | INTRAMUSCULAR | Status: DC | PRN

## 2015-03-27 MED ORDER — KETOROLAC TROMETHAMINE 30 MG/ML IJ SOLN
30.0000 mg | Freq: Once | INTRAMUSCULAR | Status: AC
  Administered 2015-03-27: 30 mg via INTRAVENOUS

## 2015-03-27 MED ORDER — LACTATED RINGERS IV SOLN: INTRAVENOUS | Status: DC

## 2015-03-27 MED ORDER — ONDANSETRON HCL 4 MG/2ML IJ SOLN: 4.0000 mg | Freq: Once | INTRAMUSCULAR | Status: DC | PRN

## 2015-03-27 MED ORDER — HYDROMORPHONE HCL 1 MG/ML IJ SOLN
0.5000 mg | INTRAMUSCULAR | Status: AC | PRN
  Administered 2015-03-27 (×4): 0.5 mg via INTRAVENOUS

## 2015-03-27 MED ORDER — FENTANYL CITRATE (PF) 100 MCG/2ML IJ SOLN
INTRAMUSCULAR | Status: DC | PRN
  Administered 2015-03-27: 50 ug via INTRAVENOUS
  Administered 2015-03-27: 100 ug via INTRAVENOUS
  Administered 2015-03-27 (×2): 50 ug via INTRAVENOUS
  Administered 2015-03-27: 100 ug via INTRAVENOUS

## 2015-03-27 MED ORDER — ONDANSETRON HCL 4 MG/2ML IJ SOLN
INTRAMUSCULAR | Status: DC | PRN
  Administered 2015-03-27: 4 mg via INTRAVENOUS

## 2015-03-27 MED ORDER — NEOSTIGMINE METHYLSULFATE 10 MG/10ML IV SOLN
INTRAVENOUS | Status: AC
  Filled 2015-03-27: qty 1

## 2015-03-27 MED ORDER — DEXAMETHASONE SODIUM PHOSPHATE 4 MG/ML IJ SOLN
INTRAMUSCULAR | Status: AC
  Filled 2015-03-27: qty 1

## 2015-03-27 MED ORDER — LIDOCAINE HCL (CARDIAC) 20 MG/ML IV SOLN
INTRAVENOUS | Status: AC
  Filled 2015-03-27: qty 5

## 2015-03-27 MED ORDER — LIDOCAINE HCL (CARDIAC) 20 MG/ML IV SOLN
INTRAVENOUS | Status: DC | PRN
  Administered 2015-03-27: 100 mg via INTRAVENOUS

## 2015-03-27 MED ORDER — KETOROLAC TROMETHAMINE 30 MG/ML IJ SOLN
INTRAMUSCULAR | Status: AC
  Filled 2015-03-27: qty 2

## 2015-03-27 MED ORDER — GLYCOPYRROLATE 0.2 MG/ML IJ SOLN
INTRAMUSCULAR | Status: AC
  Filled 2015-03-27: qty 3

## 2015-03-27 MED ORDER — ACETAMINOPHEN 10 MG/ML IV SOLN
1000.0000 mg | Freq: Four times a day (QID) | INTRAVENOUS | Status: DC
  Administered 2015-03-27: 1000 mg via INTRAVENOUS
  Filled 2015-03-27: qty 100

## 2015-03-27 MED ORDER — LACTATED RINGERS IV SOLN
INTRAVENOUS | Status: DC
  Administered 2015-03-27: 13:00:00 via INTRAVENOUS

## 2015-03-27 MED ORDER — MIDAZOLAM HCL 2 MG/2ML IJ SOLN
INTRAMUSCULAR | Status: DC | PRN
  Administered 2015-03-27: 2 mg via INTRAVENOUS

## 2015-03-27 MED ORDER — PROPOFOL 10 MG/ML IV BOLUS
INTRAVENOUS | Status: AC
  Filled 2015-03-27: qty 20

## 2015-03-27 MED ORDER — FENTANYL CITRATE (PF) 250 MCG/5ML IJ SOLN
INTRAMUSCULAR | Status: AC
  Filled 2015-03-27: qty 5

## 2015-03-27 MED ORDER — FENTANYL CITRATE (PF) 100 MCG/2ML IJ SOLN
INTRAMUSCULAR | Status: AC
  Filled 2015-03-27: qty 2

## 2015-03-27 MED ORDER — BUPIVACAINE HCL (PF) 0.25 % IJ SOLN
INTRAMUSCULAR | Status: DC | PRN
  Administered 2015-03-27: 10 mL

## 2015-03-27 MED ORDER — PROPOFOL 10 MG/ML IV BOLUS
INTRAVENOUS | Status: DC | PRN
  Administered 2015-03-27: 180 mg via INTRAVENOUS

## 2015-03-27 MED ORDER — 0.9 % SODIUM CHLORIDE (POUR BTL) OPTIME
TOPICAL | Status: DC | PRN
  Administered 2015-03-27: 1000 mL

## 2015-03-27 SURGICAL SUPPLY — 32 items
BAG SPEC RTRVL LRG 6X4 10 (ENDOMECHANICALS)
BLADE SURG 15 STRL LF C SS BP (BLADE) ×1 IMPLANT
BLADE SURG 15 STRL SS (BLADE) ×3
CABLE HIGH FREQUENCY MONO STRZ (ELECTRODE) IMPLANT
CATH ROBINSON RED A/P 16FR (CATHETERS) ×2 IMPLANT
CHLORAPREP W/TINT 26ML (MISCELLANEOUS) ×3 IMPLANT
CLOTH BEACON ORANGE TIMEOUT ST (SAFETY) ×3 IMPLANT
DRSG COVADERM PLUS 2X2 (GAUZE/BANDAGES/DRESSINGS) ×6 IMPLANT
DRSG OPSITE POSTOP 3X4 (GAUZE/BANDAGES/DRESSINGS) ×2 IMPLANT
FORCEPS CUTTING 33CM 5MM (CUTTING FORCEPS) IMPLANT
GLOVE BIOGEL PI IND STRL 7.0 (GLOVE) ×1 IMPLANT
GLOVE BIOGEL PI INDICATOR 7.0 (GLOVE) ×2
GLOVE ECLIPSE 7.0 STRL STRAW (GLOVE) ×6 IMPLANT
GOWN STRL REUS W/TWL LRG LVL3 (GOWN DISPOSABLE) ×9 IMPLANT
NS IRRIG 1000ML POUR BTL (IV SOLUTION) ×3 IMPLANT
PACK LAPAROSCOPY BASIN (CUSTOM PROCEDURE TRAY) ×3 IMPLANT
PAD POSITIONER PINK NONSTERILE (MISCELLANEOUS) ×3 IMPLANT
POUCH SPECIMEN RETRIEVAL 10MM (ENDOMECHANICALS) IMPLANT
PROTECTOR NERVE ULNAR (MISCELLANEOUS) ×3 IMPLANT
SET IRRIG TUBING LAPAROSCOPIC (IRRIGATION / IRRIGATOR) IMPLANT
SHEARS HARMONIC ACE PLUS 36CM (ENDOMECHANICALS) IMPLANT
SUT VIC AB 3-0 X1 27 (SUTURE) IMPLANT
SUT VICRYL 0 UR6 27IN ABS (SUTURE) ×6 IMPLANT
SUT VICRYL 4-0 PS2 18IN ABS (SUTURE) ×3 IMPLANT
TOWEL OR 17X24 6PK STRL BLUE (TOWEL DISPOSABLE) ×6 IMPLANT
TRAY FOLEY CATH SILVER 14FR (SET/KITS/TRAYS/PACK) ×3 IMPLANT
TROCAR BALL TOP DISP 5MM (ENDOMECHANICALS) ×6 IMPLANT
TROCAR BALLN 12MMX100 BLUNT (TROCAR) IMPLANT
TROCAR XCEL DIL TIP R 11M (ENDOMECHANICALS) IMPLANT
TROCAR XCEL NON-BLD 5MMX100MML (ENDOMECHANICALS) ×6 IMPLANT
WARMER LAPAROSCOPE (MISCELLANEOUS) ×3 IMPLANT
WATER STERILE IRR 1000ML POUR (IV SOLUTION) ×3 IMPLANT

## 2015-03-27 NOTE — Transfer of Care (Signed)
Immediate Anesthesia Transfer of Care Note  Patient: Caitlin Berger  Procedure(s) Performed: Procedure(s): LAPAROSCOPIC BILATERAL SALPINGECTOMY (Bilateral)  Patient Location: PACU  Anesthesia Type:General  Level of Consciousness: awake, alert  and oriented  Airway & Oxygen Therapy: Patient Spontanous Breathing and Patient connected to nasal cannula oxygen  Post-op Assessment: Report given to RN, Post -op Vital signs reviewed and stable and Patient moving all extremities  Post vital signs: Reviewed and stable  Last Vitals:  Filed Vitals:   03/27/15 1226  BP: 91/66  Pulse: 68  Temp: 36.6 C  Resp: 20    Complications: No apparent anesthesia complications

## 2015-03-27 NOTE — Anesthesia Postprocedure Evaluation (Signed)
  Anesthesia Post-op Note  Patient: Caitlin Berger  Procedure(s) Performed: Procedure(s): LAPAROSCOPIC BILATERAL SALPINGECTOMY (Bilateral)  Patient Location: PACU  Anesthesia Type:General  Level of Consciousness: awake, alert  and oriented  Airway and Oxygen Therapy: Patient Spontanous Breathing  Post-op Pain: mild  Post-op Assessment: Post-op Vital signs reviewed, Patient's Cardiovascular Status Stable, Respiratory Function Stable, Patent Airway, No signs of Nausea or vomiting and Pain level controlled              Post-op Vital Signs: Reviewed and stable  Last Vitals:  Filed Vitals:   03/27/15 1600  BP: 111/67  Pulse: 54  Temp: 36.2 C  Resp: 20    Complications: No apparent anesthesia complications

## 2015-03-27 NOTE — Interval H&P Note (Signed)
History and Physical Interval Note:  03/27/2015 1:37 PM  Caitlin Berger  has presented today for surgery, with the diagnosis of  Undesired fertility  The various methods of treatment have been discussed with the patient and family. After consideration of risks, benefits and other options for treatment, the patient has consented to  Procedure(s): LAPAROSCOPIC BILATERAL SALPINGECTOMY (Bilateral) as a surgical intervention .  The patient's history has been reviewed, patient examined, no change in status, stable for surgery.  I have reviewed the patient's chart and labs.  Questions were answered to the patient's satisfaction.     [name] S

## 2015-03-27 NOTE — Discharge Instructions (Signed)
DISCHARGE INSTRUCTIONS: Laparoscopy  The following instructions have been prepared to help you care for yourself upon your return home today.  May take Ibuprofen after 9:30 pm as needed for pain.  Wound care:  Do not get the incision wet for the first 24 hours. The incision should be kept clean and dry.  The Band-Aids or dressings may be removed the day after surgery.  Should the incision become sore, red, and swollen after the first week, check with your doctor.  Personal hygiene:  Shower the day after your procedure.  Activity and limitations:  Do NOT drive or operate any equipment today.  Do NOT lift anything more than 15 pounds for 2-3 weeks after surgery.  Do NOT rest in bed all day.  Walking is encouraged. Walk each day, starting slowly with 5-minute walks 3 or 4 times a day. Slowly increase the length of your walks.  Walk up and down stairs slowly.  Do NOT do strenuous activities, such as golfing, playing tennis, bowling, running, biking, weight lifting, gardening, mowing, or vacuuming for 2-4 weeks. Ask your doctor when it is okay to start.  Diet: Eat a light meal as desired this evening. You may resume your usual diet tomorrow.  Return to work: This is dependent on the type of work you do. For the most part you can return to a desk job within a week of surgery. If you are more active at work, please discuss this with your doctor.  What to expect after your surgery: You may have a slight burning sensation when you urinate on the first day. You may have a very small amount of blood in the urine. Expect to have a small amount of vaginal discharge/light bleeding for 1-2 weeks. It is not unusual to have abdominal soreness and bruising for up to 2 weeks. You may be tired and need more rest for about 1 week. You may experience shoulder pain for 24-72 hours. Lying flat in bed may relieve it.  Call your doctor for any of the following:  Develop a fever of 100.4 or greater   Inability to urinate 6 hours after discharge from hospital  Severe pain not relieved by pain medications  Persistent of heavy bleeding at incision site  Redness or swelling around incision site after a week  Increasing nausea or vomiting                     Laparoscopic Tubal Ligation Care After Refer to this sheet in the next few weeks. These instructions provide you with information on caring for yourself after your procedure. Your caregiver may also give you more specific instructions. Your treatment has been planned according to current medical practices, but problems sometimes occur. Call your caregiver if you have any problems or questions after your procedure. HOME CARE INSTRUCTIONS   Rest the remainder of the day.  Only take over-the-counter or prescription medicines for pain, discomfort, or fever as directed by your caregiver. Do not take aspirin. It can cause bleeding.  Gradually resume daily activities, diet, rest, driving, and work.  Avoid sexual intercourse for 2 weeks or as directed.  Do not use tampons or douche.  Do not drive while taking pain medicine.  Do not lift anything over 5 pounds for 2 weeks or as directed.  Only take showers, not baths, until you are seen by your caregiver.  Change bandages (dressings) as directed.  Take your temperature twice a day and record it.  Try to  have help for the first 7 to 10 days for your household needs.  Return to your caregiver to get your stitches (sutures) removed and for follow-up visits as directed. SEEK MEDICAL CARE IF:   You have redness, swelling, or increasing pain in a wound.  You have drainage from a wound lasting longer than 1 day.  Your pain is getting worse.  You have a rash.  You become dizzy or lightheaded.  You have a reaction to your medicine.  You need stronger medicine or a change in your pain medicine.  You notice a bad smell coming from a wound or dressing.  Your  wound breaks open after the sutures have been removed.  You are constipated. SEEK IMMEDIATE MEDICAL CARE IF:   You faint.  You have a fever.  You have increasing abdominal pain.  You have severe pain in your shoulders.  You have bleeding or drainage from the suture sites or vagina following surgery.  You have shortness of breath or difficulty breathing.  You have chest or leg pain.  You have persistent nausea, vomiting, or diarrhea. MAKE SURE YOU:   Understand these instructions.  Watch your condition.  Get help right away if you are not doing well or get worse. Document Released: 04/10/2005 Document Revised: 03/22/2012 Document Reviewed: 01/02/2012 [facility] Patient Information 2015 [place], [place]. This information is not intended to replace advice given to you by your health care provider. Make sure you discuss any questions you have with your health care provider.

## 2015-03-27 NOTE — Anesthesia Procedure Notes (Signed)
Procedure Name: Intubation Date/Time: 03/27/2015 2:13 PM Performed by: Elgie Congo Pre-anesthesia Checklist: Patient identified, Patient being monitored, Emergency Drugs available and Suction available Patient Re-evaluated:Patient Re-evaluated prior to inductionOxygen Delivery Method: Circle system utilized Preoxygenation: Pre-oxygenation with 100% oxygen Intubation Type: IV induction Ventilation: Mask ventilation without difficulty Laryngoscope Size: Mac and 3 Grade View: Grade I Tube type: Oral Tube size: 7.0 mm Number of attempts: 1 Airway Equipment and Method: Stylet Placement Confirmation: ETT inserted through vocal cords under direct vision,  positive ETCO2 and breath sounds checked- equal and bilateral Secured at: 21 cm Tube secured with: Tape Dental Injury: Teeth and Oropharynx as per pre-operative assessment
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
## 2015-03-27 NOTE — Anesthesia Preprocedure Evaluation (Addendum)
Anesthesia Evaluation  Patient identified by MRN, date of birth, ID band Patient awake    Reviewed: Allergy & Precautions, NPO status , Patient's Chart, lab work & pertinent test results  History of Anesthesia Complications Negative for: history of anesthetic complications  Airway Mallampati: I  TM Distance: >3 FB Neck ROM: Full    Dental no notable dental hx. (+) Teeth Intact   Pulmonary asthma , Current Smoker, former smoker,  breath sounds clear to auscultation  Pulmonary exam normal       Cardiovascular negative cardio ROS Normal cardiovascular examRhythm:Regular Rate:Normal     Neuro/Psych  Headaches, PSYCHIATRIC DISORDERS Anxiety Depression Bipolar Disorder    GI/Hepatic Neg liver ROS, GERD-  Medicated and Controlled,  Endo/Other  negative endocrine ROS  Renal/GU negative Renal ROS  negative genitourinary   Musculoskeletal negative musculoskeletal ROS (+)   Abdominal   Peds negative pediatric ROS (+)  Hematology negative hematology ROS (+)   Anesthesia Other Findings   Reproductive/Obstetrics negative OB ROS                          Anesthesia Physical Anesthesia Plan  ASA: II  Anesthesia Plan: General   Post-op Pain Management:    Induction: Intravenous  Airway Management Planned: Oral ETT  Additional Equipment:   Intra-op Plan:   Post-operative Plan: Extubation in OR  Informed Consent: I have reviewed the patients History and Physical, chart, labs and discussed the procedure including the risks, benefits and alternatives for the proposed anesthesia with the patient or authorized representative who has indicated his/her understanding and acceptance.   Dental advisory given  Plan Discussed with: CRNA, Anesthesiologist and Surgeon  Anesthesia Plan Comments:        Anesthesia Quick Evaluation

## 2015-03-30 ENCOUNTER — Encounter (HOSPITAL_COMMUNITY): Payer: Self-pay | Admitting: Family Medicine

## 2015-04-01 ENCOUNTER — Encounter: Payer: Self-pay | Admitting: *Deleted

## 2015-04-10 ENCOUNTER — Ambulatory Visit: Payer: Medicaid Other | Admitting: Family Medicine

## 2015-04-29 ENCOUNTER — Ambulatory Visit: Payer: Medicaid Other | Admitting: Family Medicine

## 2015-09-11 ENCOUNTER — Institutional Professional Consult (permissible substitution): Payer: Medicaid Other | Admitting: Diagnostic Neuroimaging

## 2018-02-14 ENCOUNTER — Emergency Department (HOSPITAL_COMMUNITY): Payer: Self-pay

## 2018-02-14 ENCOUNTER — Emergency Department (HOSPITAL_COMMUNITY)
Admission: EM | Admit: 2018-02-14 | Discharge: 2018-02-15 | Disposition: A | Discharge status: Home / Self Care | Payer: Self-pay | Attending: Emergency Medicine | Admitting: Emergency Medicine

## 2018-02-14 ENCOUNTER — Other Ambulatory Visit: Payer: Self-pay

## 2018-02-14 DIAGNOSIS — Z5321 Procedure and treatment not carried out due to patient leaving prior to being seen by health care provider: Secondary | ICD-10-CM | POA: Insufficient documentation

## 2018-02-14 DIAGNOSIS — Z0489 Encounter for examination and observation for other specified reasons: Secondary | ICD-10-CM | POA: Insufficient documentation

## 2018-02-14 DIAGNOSIS — R0602 Shortness of breath: Secondary | ICD-10-CM | POA: Insufficient documentation
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
## 2018-02-14 NOTE — ED Notes (Signed)
No answer from patient for vital recheck

## 2018-02-15 ENCOUNTER — Emergency Department (HOSPITAL_COMMUNITY)
Admission: EM | Admit: 2018-02-15 | Discharge: 2018-02-15 | Disposition: A | Discharge status: Home / Self Care | Payer: Self-pay | Attending: Emergency Medicine | Admitting: Emergency Medicine

## 2018-02-15 ENCOUNTER — Emergency Department (HOSPITAL_COMMUNITY): Payer: Self-pay

## 2018-02-15 ENCOUNTER — Encounter (HOSPITAL_COMMUNITY): Payer: Self-pay | Admitting: Emergency Medicine

## 2018-02-15 DIAGNOSIS — Z87891 Personal history of nicotine dependence: Secondary | ICD-10-CM | POA: Insufficient documentation

## 2018-02-15 DIAGNOSIS — S20211A Contusion of right front wall of thorax, initial encounter: Secondary | ICD-10-CM | POA: Insufficient documentation

## 2018-02-15 DIAGNOSIS — Y939 Activity, unspecified: Secondary | ICD-10-CM | POA: Insufficient documentation

## 2018-02-15 DIAGNOSIS — J45909 Unspecified asthma, uncomplicated: Secondary | ICD-10-CM | POA: Insufficient documentation

## 2018-02-15 DIAGNOSIS — Y999 Unspecified external cause status: Secondary | ICD-10-CM | POA: Insufficient documentation

## 2018-02-15 DIAGNOSIS — Y929 Unspecified place or not applicable: Secondary | ICD-10-CM | POA: Insufficient documentation

## 2018-02-15 DIAGNOSIS — Z79899 Other long term (current) drug therapy: Secondary | ICD-10-CM | POA: Insufficient documentation

## 2018-02-15 DIAGNOSIS — S0003XA Contusion of scalp, initial encounter: Secondary | ICD-10-CM | POA: Insufficient documentation

## 2018-02-15 LAB — POC URINE PREG, ED: Preg Test, Ur: NEGATIVE

## 2018-02-15 LAB — RAPID URINE DRUG SCREEN, HOSP PERFORMED
Amphetamines: POSITIVE — AB
BARBITURATES: NOT DETECTED
Benzodiazepines: POSITIVE — AB
COCAINE: NOT DETECTED
Opiates: POSITIVE — AB
TETRAHYDROCANNABINOL: POSITIVE — AB

## 2018-02-15 MED ORDER — METHOCARBAMOL 500 MG PO TABS: 500.0000 mg | ORAL_TABLET | Freq: Two times a day (BID) | ORAL | 0 refills | Status: DC

## 2018-02-15 MED ORDER — NAPROXEN 375 MG PO TABS: 375.0000 mg | ORAL_TABLET | Freq: Two times a day (BID) | ORAL | 0 refills | Status: DC

## 2018-02-15 MED ORDER — METHOCARBAMOL 500 MG PO TABS
500.0000 mg | ORAL_TABLET | Freq: Once | ORAL | Status: AC
  Administered 2018-02-15: 500 mg via ORAL
  Filled 2018-02-15: qty 1

## 2018-02-15 NOTE — Discharge Instructions (Signed)
Do not drive while taking the Robaxin as it will make you sleepy. Follow up with your doctor. Return here as needed.
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
## 2018-10-07 ENCOUNTER — Other Ambulatory Visit: Payer: Self-pay

## 2018-10-07 ENCOUNTER — Emergency Department (HOSPITAL_COMMUNITY)
Admission: EM | Admit: 2018-10-07 | Discharge: 2018-10-07 | Disposition: A | Discharge status: Home / Self Care | Payer: Self-pay | Attending: Emergency Medicine | Admitting: Emergency Medicine

## 2018-10-07 ENCOUNTER — Encounter (HOSPITAL_COMMUNITY): Payer: Self-pay

## 2018-10-07 ENCOUNTER — Emergency Department (HOSPITAL_COMMUNITY): Payer: Self-pay

## 2018-10-07 DIAGNOSIS — M79641 Pain in right hand: Secondary | ICD-10-CM | POA: Insufficient documentation

## 2018-10-07 DIAGNOSIS — F17210 Nicotine dependence, cigarettes, uncomplicated: Secondary | ICD-10-CM | POA: Insufficient documentation

## 2018-10-07 DIAGNOSIS — J45909 Unspecified asthma, uncomplicated: Secondary | ICD-10-CM | POA: Insufficient documentation

## 2018-10-07 DIAGNOSIS — Z79899 Other long term (current) drug therapy: Secondary | ICD-10-CM | POA: Insufficient documentation

## 2018-10-07 MED ORDER — ACETAMINOPHEN 325 MG PO TABS
650.0000 mg | ORAL_TABLET | Freq: Once | ORAL | Status: AC
  Administered 2018-10-07: 650 mg via ORAL
  Filled 2018-10-07: qty 2

## 2018-10-07 NOTE — ED Notes (Signed)
Ace wrap applied to right hand.

## 2018-10-07 NOTE — ED Notes (Signed)
Pt agrees to discharge. Pt has no questions at this time. Pt unable to sign due to topaz being unavailable.
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
## 2018-10-07 NOTE — Discharge Instructions (Signed)
Affected hand.  Take Tylenol or Motrin as needed for pain.  Return to the ED immediately for new or worsening symptoms, numbness, decreased range of motion, increased redness or swelling or any concerns at all.
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
## 2019-11-22 ENCOUNTER — Other Ambulatory Visit: Payer: Self-pay

## 2019-11-22 ENCOUNTER — Emergency Department (HOSPITAL_BASED_OUTPATIENT_CLINIC_OR_DEPARTMENT_OTHER)
Admission: EM | Admit: 2019-11-22 | Discharge: 2019-11-22 | Disposition: A | Discharge status: Home / Self Care | Payer: Self-pay | Attending: Emergency Medicine | Admitting: Emergency Medicine

## 2019-11-22 ENCOUNTER — Encounter (HOSPITAL_BASED_OUTPATIENT_CLINIC_OR_DEPARTMENT_OTHER): Payer: Self-pay

## 2019-11-22 ENCOUNTER — Emergency Department (HOSPITAL_BASED_OUTPATIENT_CLINIC_OR_DEPARTMENT_OTHER): Payer: Self-pay

## 2019-11-22 DIAGNOSIS — Z881 Allergy status to other antibiotic agents status: Secondary | ICD-10-CM | POA: Insufficient documentation

## 2019-11-22 DIAGNOSIS — Z79899 Other long term (current) drug therapy: Secondary | ICD-10-CM | POA: Insufficient documentation

## 2019-11-22 DIAGNOSIS — F17210 Nicotine dependence, cigarettes, uncomplicated: Secondary | ICD-10-CM | POA: Insufficient documentation

## 2019-11-22 DIAGNOSIS — J45909 Unspecified asthma, uncomplicated: Secondary | ICD-10-CM | POA: Insufficient documentation

## 2019-11-22 DIAGNOSIS — Z888 Allergy status to other drugs, medicaments and biological substances status: Secondary | ICD-10-CM | POA: Insufficient documentation

## 2019-11-22 DIAGNOSIS — M7652 Patellar tendinitis, left knee: Secondary | ICD-10-CM | POA: Insufficient documentation

## 2019-11-22 MED ORDER — MELOXICAM 15 MG PO TABS: ORAL_TABLET | ORAL | 0 refills | Status: DC
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
## 2019-11-22 NOTE — ED Triage Notes (Signed)
Pt c/o left knee pain started while on a walk yesterday-denies injury-NAD-steady gait

## 2019-12-10 ENCOUNTER — Encounter (HOSPITAL_BASED_OUTPATIENT_CLINIC_OR_DEPARTMENT_OTHER): Payer: Self-pay | Admitting: Emergency Medicine

## 2019-12-10 ENCOUNTER — Emergency Department (HOSPITAL_BASED_OUTPATIENT_CLINIC_OR_DEPARTMENT_OTHER): Payer: Self-pay

## 2019-12-10 ENCOUNTER — Other Ambulatory Visit: Payer: Self-pay

## 2019-12-10 ENCOUNTER — Emergency Department (HOSPITAL_BASED_OUTPATIENT_CLINIC_OR_DEPARTMENT_OTHER)
Admission: EM | Admit: 2019-12-10 | Discharge: 2019-12-10 | Disposition: A | Discharge status: Home / Self Care | Payer: Self-pay | Attending: Emergency Medicine | Admitting: Emergency Medicine

## 2019-12-10 DIAGNOSIS — J45909 Unspecified asthma, uncomplicated: Secondary | ICD-10-CM | POA: Insufficient documentation

## 2019-12-10 DIAGNOSIS — R600 Localized edema: Secondary | ICD-10-CM | POA: Insufficient documentation

## 2019-12-10 DIAGNOSIS — M25561 Pain in right knee: Secondary | ICD-10-CM | POA: Insufficient documentation

## 2019-12-10 DIAGNOSIS — R609 Edema, unspecified: Secondary | ICD-10-CM

## 2019-12-10 NOTE — ED Notes (Signed)
Provider at the bedside.  

## 2019-12-10 NOTE — ED Triage Notes (Signed)
Pt c/o swelling to left leg ongoing since 11/22/2019, when she was seen for knee pain.
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
## 2019-12-10 NOTE — ED Notes (Signed)
Taken to US at this time. 

## 2019-12-10 NOTE — Discharge Instructions (Signed)
Please read and follow all provided instructions.  Your diagnoses today include:  1. Peripheral edema     Tests performed today include:  Vital signs. See below for your results today.   Medications prescribed:   None  Take any prescribed medications only as directed.  Home care instructions:  Follow any educational materials contained in this packet.  BE VERY CAREFUL not to take multiple medicines containing Tylenol (also called acetaminophen). Doing so can lead to an overdose which can damage your liver and cause liver failure and possibly death.   Follow-up instructions: Please follow-up with the sports medicine doctor listed for further evaluation.   Return instructions:   Please return to the Emergency Department if you experience worsening symptoms.   Please return if you have any other emergent concerns.  Additional Information:  Your vital signs today were: BP 116/90 (BP Location: Left Arm)   Pulse (!) 104   Temp 98 F (36.7 C) (Oral)   Resp 18   Ht 5\' 5"  (1.651 m)   Wt 59 kg   LMP 12/10/2019   SpO2 96%   BMI 21.63 kg/m  If your blood pressure (BP) was elevated above 135/85 this visit, please have this repeated by your doctor within one month. --------------

## 2020-03-29 DIAGNOSIS — F319 Bipolar disorder, unspecified: Secondary | ICD-10-CM | POA: Insufficient documentation

## 2020-06-14 ENCOUNTER — Other Ambulatory Visit: Payer: Self-pay

## 2020-08-16 ENCOUNTER — Other Ambulatory Visit (HOSPITAL_BASED_OUTPATIENT_CLINIC_OR_DEPARTMENT_OTHER): Payer: Self-pay | Admitting: Physician Assistant

## 2020-08-16 ENCOUNTER — Encounter (HOSPITAL_BASED_OUTPATIENT_CLINIC_OR_DEPARTMENT_OTHER): Payer: Self-pay | Admitting: *Deleted

## 2020-08-16 ENCOUNTER — Emergency Department (HOSPITAL_BASED_OUTPATIENT_CLINIC_OR_DEPARTMENT_OTHER)
Admission: EM | Admit: 2020-08-16 | Discharge: 2020-08-16 | Disposition: A | Discharge status: Home / Self Care | Payer: Self-pay | Attending: Emergency Medicine | Admitting: Emergency Medicine

## 2020-08-16 ENCOUNTER — Other Ambulatory Visit: Payer: Self-pay

## 2020-08-16 DIAGNOSIS — K0889 Other specified disorders of teeth and supporting structures: Secondary | ICD-10-CM | POA: Insufficient documentation

## 2020-08-16 DIAGNOSIS — J45909 Unspecified asthma, uncomplicated: Secondary | ICD-10-CM | POA: Insufficient documentation

## 2020-08-16 DIAGNOSIS — F17210 Nicotine dependence, cigarettes, uncomplicated: Secondary | ICD-10-CM | POA: Insufficient documentation

## 2020-08-16 MED ORDER — NAPROXEN 500 MG PO TABS: 500.0000 mg | ORAL_TABLET | Freq: Two times a day (BID) | ORAL | 0 refills | Status: DC

## 2020-08-16 MED ORDER — AMOXICILLIN 500 MG PO CAPS
500.0000 mg | ORAL_CAPSULE | Freq: Once | ORAL | Status: AC
  Administered 2020-08-16: 500 mg via ORAL
  Filled 2020-08-16: qty 1

## 2020-08-16 MED ORDER — AMOXICILLIN 500 MG PO CAPS: 500.0000 mg | ORAL_CAPSULE | Freq: Two times a day (BID) | ORAL | 0 refills | Status: DC

## 2020-08-16 MED ORDER — KETOROLAC TROMETHAMINE 30 MG/ML IJ SOLN
30.0000 mg | Freq: Once | INTRAMUSCULAR | Status: AC
  Administered 2020-08-16: 30 mg via INTRAMUSCULAR
  Filled 2020-08-16: qty 1

## 2020-08-16 MED ORDER — HYDROCODONE-ACETAMINOPHEN 5-325 MG PO TABS
1.0000 | ORAL_TABLET | Freq: Once | ORAL | Status: AC
  Administered 2020-08-16: 1 via ORAL
  Filled 2020-08-16: qty 1

## 2020-08-16 MED FILL — NAPROXEN 500 MG TABS: 500 | 15 days supply | Qty: 30 | Fill #0

## 2020-08-16 MED FILL — AMOXICILLIN 500 MG CAPSULE: 500 | 7 days supply | Qty: 14 | Fill #0

## 2020-08-16 NOTE — ED Triage Notes (Signed)
Toothache and left ear pain x over a week.
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
## 2020-08-16 NOTE — Discharge Instructions (Addendum)
Take the medications as prescribed.  Return for any worsening symptoms. °

## 2020-12-22 ENCOUNTER — Emergency Department (HOSPITAL_COMMUNITY)
Admission: EM | Admit: 2020-12-22 | Discharge: 2020-12-22 | Disposition: A | Discharge status: Home / Self Care | Payer: Self-pay | Attending: Emergency Medicine | Admitting: Emergency Medicine

## 2020-12-22 ENCOUNTER — Encounter (HOSPITAL_COMMUNITY): Payer: Self-pay | Admitting: Emergency Medicine

## 2020-12-22 DIAGNOSIS — J45909 Unspecified asthma, uncomplicated: Secondary | ICD-10-CM | POA: Insufficient documentation

## 2020-12-22 DIAGNOSIS — F17210 Nicotine dependence, cigarettes, uncomplicated: Secondary | ICD-10-CM | POA: Insufficient documentation

## 2020-12-22 DIAGNOSIS — L03012 Cellulitis of left finger: Secondary | ICD-10-CM | POA: Insufficient documentation

## 2020-12-22 DIAGNOSIS — Z23 Encounter for immunization: Secondary | ICD-10-CM | POA: Insufficient documentation

## 2020-12-22 HISTORY — DX: Disorder of thyroid, unspecified: E07.9

## 2020-12-22 MED ORDER — SULFAMETHOXAZOLE-TRIMETHOPRIM 800-160 MG PO TABS
1.0000 | ORAL_TABLET | Freq: Once | ORAL | Status: AC
  Administered 2020-12-22: 1 via ORAL
  Filled 2020-12-22: qty 1

## 2020-12-22 MED ORDER — SULFAMETHOXAZOLE-TRIMETHOPRIM 800-160 MG PO TABS: 1.0000 | ORAL_TABLET | Freq: Two times a day (BID) | ORAL | 0 refills | Status: AC

## 2020-12-22 MED ORDER — TETANUS-DIPHTH-ACELL PERTUSSIS 5-2.5-18.5 LF-MCG/0.5 IM SUSY
0.5000 mL | PREFILLED_SYRINGE | Freq: Once | INTRAMUSCULAR | Status: AC
  Administered 2020-12-22: 0.5 mL via INTRAMUSCULAR
  Filled 2020-12-22: qty 0.5

## 2020-12-22 MED ORDER — LIDOCAINE HCL (PF) 1 % IJ SOLN
30.0000 mL | Freq: Once | INTRAMUSCULAR | Status: AC
  Administered 2020-12-22: 30 mL
  Filled 2020-12-22: qty 30

## 2020-12-22 NOTE — ED Notes (Signed)
Placed dressing on pt family and per pt request given additional dressing

## 2020-12-22 NOTE — ED Notes (Signed)
No answer in lobby.

## 2020-12-22 NOTE — ED Triage Notes (Signed)
C/o L middle finger infection since getting nails done 3 weeks ago.

## 2020-12-22 NOTE — Discharge Instructions (Addendum)
Came to the emergency department today to be evaluated for your left middle finger welling, pain, and redness.  Your physical exam was consistent with a paronychia.  Performed a drainage of this paronychia.  I have also started you on antibiotics to help with any infection.  Please take 1 pill twice a day for the next 7 days.  Please follow-up with your primary care provider, in urgent care, or return to the emergency department in 2 to 3 days for reevaluation of your finger.  You may perform warm compresses 2-3 times a day to help with any further drainage from your affected finger.  You may have diarrhea from the antibiotics.  It is very important that you continue to take the antibiotics even if you get diarrhea unless a medical professional tells you that you may stop taking them.  If you stop too early the bacteria you are being treated for will become stronger and you may need different, more powerful antibiotics that have more side effects and worsening diarrhea.  Please stay well hydrated and consider probiotics as they may decrease the severity of your diarrhea.  Please be aware that if you take any hormonal contraception (birth control pills, nexplanon, the ring, etc) that your birth control will not work while you are taking antibiotics and you need to use back up protection as directed on the birth control medication information insert.    Get help right away if you have: A fever or chills. Redness spreading away from the affected area. Joint or muscle pain.
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
## 2021-05-15 ENCOUNTER — Other Ambulatory Visit: Payer: Self-pay

## 2021-05-15 ENCOUNTER — Ambulatory Visit
Admission: EM | Admit: 2021-05-15 | Discharge: 2021-05-15 | Disposition: A | Discharge status: Home / Self Care | Payer: Self-pay | Attending: Urgent Care | Admitting: Urgent Care

## 2021-05-15 ENCOUNTER — Ambulatory Visit (INDEPENDENT_AMBULATORY_CARE_PROVIDER_SITE_OTHER): Payer: Self-pay

## 2021-05-15 DIAGNOSIS — T148XXA Other injury of unspecified body region, initial encounter: Secondary | ICD-10-CM

## 2021-05-15 DIAGNOSIS — L089 Local infection of the skin and subcutaneous tissue, unspecified: Secondary | ICD-10-CM

## 2021-05-15 DIAGNOSIS — W540XXA Bitten by dog, initial encounter: Secondary | ICD-10-CM

## 2021-05-15 DIAGNOSIS — M79644 Pain in right finger(s): Secondary | ICD-10-CM

## 2021-05-15 MED ORDER — NAPROXEN 500 MG PO TABS: 500.0000 mg | ORAL_TABLET | Freq: Two times a day (BID) | ORAL | 0 refills | Status: DC

## 2021-05-15 MED ORDER — AMOXICILLIN-POT CLAVULANATE 875-125 MG PO TABS: 1.0000 | ORAL_TABLET | Freq: Two times a day (BID) | ORAL | 0 refills | Status: DC

## 2021-05-15 NOTE — ED Triage Notes (Signed)
Pt c/o dog bite to right thumb that occurred 2 days ago. States she is concerned it is infected and something may not "be right with the bone." States it is 9/10 tingle sharp pulsating burning pain.
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
## 2022-02-17 ENCOUNTER — Other Ambulatory Visit (HOSPITAL_BASED_OUTPATIENT_CLINIC_OR_DEPARTMENT_OTHER): Payer: Self-pay

## 2022-03-04 ENCOUNTER — Encounter: Payer: Self-pay | Admitting: Emergency Medicine

## 2022-03-04 ENCOUNTER — Ambulatory Visit
Admission: EM | Admit: 2022-03-04 | Discharge: 2022-03-04 | Disposition: A | Discharge status: Home / Self Care | Payer: Self-pay | Attending: Urgent Care | Admitting: Urgent Care

## 2022-03-04 DIAGNOSIS — F419 Anxiety disorder, unspecified: Secondary | ICD-10-CM

## 2022-03-04 DIAGNOSIS — F3162 Bipolar disorder, current episode mixed, moderate: Secondary | ICD-10-CM

## 2022-03-04 DIAGNOSIS — R5383 Other fatigue: Secondary | ICD-10-CM

## 2022-03-04 DIAGNOSIS — R7989 Other specified abnormal findings of blood chemistry: Secondary | ICD-10-CM

## 2022-03-04 DIAGNOSIS — R8290 Unspecified abnormal findings in urine: Secondary | ICD-10-CM

## 2022-03-04 DIAGNOSIS — F1991 Other psychoactive substance use, unspecified, in remission: Secondary | ICD-10-CM

## 2022-03-04 DIAGNOSIS — F1290 Cannabis use, unspecified, uncomplicated: Secondary | ICD-10-CM

## 2022-03-04 LAB — POCT URINALYSIS DIP (MANUAL ENTRY)
Bilirubin, UA: NEGATIVE
Blood, UA: NEGATIVE
Glucose, UA: NEGATIVE mg/dL
Ketones, POC UA: NEGATIVE mg/dL
Leukocytes, UA: NEGATIVE
Nitrite, UA: NEGATIVE
Protein Ur, POC: NEGATIVE mg/dL
Spec Grav, UA: 1.025 (ref 1.010–1.025)
Urobilinogen, UA: 0.2 E.U./dL
pH, UA: 6.5 (ref 5.0–8.0)

## 2022-03-04 MED ORDER — QUETIAPINE FUMARATE 300 MG PO TABS: 300.0000 mg | ORAL_TABLET | Freq: Every evening | ORAL | 0 refills | Status: DC | PRN

## 2022-03-04 MED ORDER — HYDROXYZINE HCL 25 MG PO TABS: 12.5000 mg | ORAL_TABLET | Freq: Three times a day (TID) | ORAL | 0 refills | Status: DC | PRN

## 2022-03-04 MED ORDER — METOPROLOL TARTRATE 50 MG PO TABS
50.0000 mg | ORAL_TABLET | Freq: Two times a day (BID) | ORAL | 0 refills | Status: DC
Start: 2022-03-04 — End: 2024-05-17
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
## 2022-03-04 NOTE — ED Triage Notes (Signed)
Pt here with severe fatigue, feeling "not right", and has a foul smell to her urine x several weeks. Pt states she is worried about her hyperthyroidism. Pt also endorses barriers to care and non-compliance with meds due to financial insecurity and lack of insurance coverage.

## 2022-04-28 ENCOUNTER — Ambulatory Visit
Admission: EM | Admit: 2022-04-28 | Discharge: 2022-04-28 | Disposition: A | Discharge status: Home / Self Care | Payer: Self-pay

## 2022-04-28 DIAGNOSIS — Z760 Encounter for issue of repeat prescription: Secondary | ICD-10-CM
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
## 2022-04-28 NOTE — ED Triage Notes (Signed)
Pt requesting medication refill for seroquel, atarax, and metoprolol.

## 2022-04-28 NOTE — ED Notes (Signed)
Pt made aware from provider that we do not usually do medication refills.

## 2022-04-28 NOTE — Discharge Instructions (Addendum)
Please be advised that we do not provide medication refills at Urgent Care.  We also do not provide routine follow up of chronic conditions such as high blood pressure, hypothyroid or mental health issues.  Please follow up with [facility] on [address], [phone].

## 2022-04-30 ENCOUNTER — Encounter: Payer: Self-pay | Admitting: Emergency Medicine

## 2022-07-25 ENCOUNTER — Encounter (HOSPITAL_COMMUNITY): Payer: Self-pay

## 2022-07-25 ENCOUNTER — Emergency Department (HOSPITAL_COMMUNITY)
Admission: EM | Admit: 2022-07-25 | Discharge: 2022-07-25 | Disposition: A | Discharge status: Home / Self Care | Payer: Self-pay | Attending: Emergency Medicine | Admitting: Emergency Medicine

## 2022-07-25 ENCOUNTER — Other Ambulatory Visit: Payer: Self-pay

## 2022-07-25 DIAGNOSIS — Z760 Encounter for issue of repeat prescription: Secondary | ICD-10-CM | POA: Insufficient documentation

## 2022-07-25 MED ORDER — METHADONE HCL 10 MG/ML PO CONC
90.0000 mg | Freq: Once | ORAL | Status: AC
  Administered 2022-07-25: 90 mg via ORAL
  Filled 2022-07-25: qty 10

## 2022-07-25 MED ORDER — METHADONE HCL 10 MG/ML PO CONC: 90.0000 mg | Freq: Once | ORAL | 0 refills | Status: AC

## 2022-07-25 NOTE — Discharge Instructions (Signed)
I have sent a dose of methadone to your pharmacy to take tomorrow.  If you have any issues getting this refilled, come tomorrow for your daily dose.

## 2022-07-25 NOTE — ED Triage Notes (Signed)
Patient reports that [facility] and missed her time to get her Methadone today. Patient states she was told to come to the ED to get her Methadone dose.

## 2022-07-25 NOTE — ED Provider Notes (Signed)
[facility]-EMERGENCY DEPT Provider Note   CSN: [id number] Arrival date & time: 07/25/22  1028     History  Chief Complaint  Patient presents with   Medication Refill    Caitlin Berger is a 36 y.o. female.  Here for methadone dose after missing dose this AM at [facility]. Sent for dose this AM. Denies any ETOH or drug use. Family is moving and was hard to get there in time. No chest pain or SOB or weakness.  The history is provided by the patient.       Home Medications Prior to Admission medications   Medication Sig Start Date End Date Taking? Authorizing Provider  methadone (DOLOPHINE) 10 MG/ML solution Take 9 mLs (90 mg total) by mouth once for 1 dose. 07/25/22 07/25/22 Yes Farhan Jean, DO  acetaminophen (TYLENOL) 500 MG tablet Take 1,000 mg by mouth every 6 (six) hours as needed for moderate pain.    [provider]  diazePAM (VALIUM PO) Take 1 tablet by mouth in the morning and at bedtime. For thyroid tremors.    [provider]  hydrOXYzine (ATARAX) 25 MG tablet Take 0.5-1 tablets (12.5-25 mg total) by mouth every 8 (eight) hours as needed for itching. 03/04/22   Wallis Bamberg, PA-C  ibuprofen (ADVIL) 200 MG tablet Take 600 mg by mouth every 6 (six) hours as needed for moderate pain.    [provider]  metoprolol tartrate (LOPRESSOR) 50 MG tablet Take 1 tablet (50 mg total) by mouth 2 (two) times daily. 03/04/22   Wallis Bamberg, PA-C  Multiple Vitamins-Minerals (HAIR SKIN AND NAILS FORMULA PO) Take 1 tablet by mouth daily.    [provider]  Multiple Vitamins-Minerals (ONE-A-DAY WOMENS PO) Take 1 tablet by mouth daily at 6 (six) AM.    [provider]  naproxen (NAPROSYN) 500 MG tablet Take 1 tablet (500 mg total) by mouth 2 (two) times daily with a meal. 05/15/21   Wallis Bamberg, PA-C  QUEtiapine (SEROQUEL) 300 MG tablet Take 1 tablet (300 mg total) by mouth at bedtime as needed  (sleep). 03/04/22   Wallis Bamberg, PA-C  amphetamine-dextroamphetamine (ADDERALL) 20 MG tablet Take 20 mg by mouth daily. 02/02/18 11/22/19  [provider]      Allergies    Amitriptyline, Cephalexin, Lithium, and Miconazole nitrate    Review of Systems   Review of Systems  Physical Exam Updated Vital Signs BP 108/68 (BP Location: Right Arm)   Pulse 75   Temp 98.1 F (36.7 C) (Oral)   Resp 14   Ht 5\' 5"  (1.651 m)   Wt 54.4 kg   LMP 07/11/2022 (Approximate)   SpO2 98%   BMI 19.97 kg/m  Physical Exam Cardiovascular:     Pulses: Normal pulses.  Pulmonary:     Effort: Pulmonary effort is normal.  Neurological:     General: No focal deficit present.     Mental Status: She is alert.     ED Results / Procedures / Treatments   Labs (all labs ordered are listed, but only abnormal results are displayed) Labs Reviewed - No data to display  EKG None  Radiology No results found.  Procedures Procedures    Medications Ordered in ED Medications  methadone (DOLOPHINE) 10 MG/ML solution 90 mg (has no administration in time range)    ED Course/ Medical Decision Making/ A&P  Medical Decision Making Risk Prescription drug management.   Caitlin Berger is here for methadone dose after missing her dose this morning.  I was able to talk on the phone with provider [facility] [place].  They were able to confirm that patient has done a very good job of showing up daily for her methadone.  Patient is in the process of moving and it was just difficult for her to get there today.  They were okay with me giving her a dose of her methadone today.  Patient takes 90 mg dose.  On Saturdays they usually give patients a take-home dose to take on Sunday as the clinic is not open on Sunday.  I will prescribe her a dose to take tomorrow.  Hopefully she will be able to get this filled and take tomorrow if not she may need to come back  to get her dose tomorrow.  Overall patient very well-appearing.  Discharged in good condition.  This chart was dictated using voice recognition software.  Despite best efforts to proofread,  errors can occur which can change the documentation meaning.         Final Clinical Impression(s) / ED Diagnoses Final diagnoses:  Medication refill    Rx / DC Orders ED Discharge Orders          Ordered    methadone (DOLOPHINE) 10 MG/ML solution   Once        10 /21/23 Durant, Vance, DO 07/25/22 1114

## 2022-07-26 ENCOUNTER — Emergency Department (HOSPITAL_COMMUNITY)
Admission: EM | Admit: 2022-07-26 | Discharge: 2022-07-26 | Disposition: A | Discharge status: Home / Self Care | Payer: Self-pay | Attending: Emergency Medicine | Admitting: Emergency Medicine

## 2022-07-26 ENCOUNTER — Encounter (HOSPITAL_COMMUNITY): Payer: Self-pay

## 2022-07-26 DIAGNOSIS — Z760 Encounter for issue of repeat prescription: Secondary | ICD-10-CM | POA: Insufficient documentation

## 2022-07-26 MED ORDER — METHADONE HCL 10 MG PO TABS
90.0000 mg | ORAL_TABLET | Freq: Once | ORAL | Status: AC
  Administered 2022-07-26: 90 mg via ORAL
  Filled 2022-07-26: qty 9

## 2022-07-26 NOTE — ED Triage Notes (Signed)
Pt presents with c/o medication refill. Pt reports that she needs her methadone dose for today, was unable to get it filled after she was here yesterday and they called it in for her. Pt has no other complaints at this time.

## 2022-07-26 NOTE — Discharge Instructions (Signed)
Follow-up with your methadone clinic tomorrow per your routine.

## 2022-07-26 NOTE — ED Provider Notes (Signed)
[place] DEPT Provider Note   CSN: [id number] Arrival date & time: 07/26/22  1601     History  Chief Complaint  Patient presents with   Medication Refill    Caitlin Berger is a 36 y.o. female.  Patient here today in need of methadone dose.  Patient was seen in the emergency department for the same yesterday.  At that time, provider confirmed that she was compliant with her medications and usual dose is 90 mg.  She was given a prescription for dose today, but the pharmacy she went to was unable to fill it.  She currently has mild sweats but otherwise no significant withdrawal symptoms.  She states that she will be able to return to her methadone clinic in the morning.       Home Medications Prior to Admission medications   Medication Sig Start Date End Date Taking? Authorizing Provider  acetaminophen (TYLENOL) 500 MG tablet Take 1,000 mg by mouth every 6 (six) hours as needed for moderate pain.    [provider]  diazePAM (VALIUM PO) Take 1 tablet by mouth in the morning and at bedtime. For thyroid tremors.    [provider]  hydrOXYzine (ATARAX) 25 MG tablet Take 0.5-1 tablets (12.5-25 mg total) by mouth every 8 (eight) hours as needed for itching. 03/04/22   Jaynee Eagles, PA-C  ibuprofen (ADVIL) 200 MG tablet Take 600 mg by mouth every 6 (six) hours as needed for moderate pain.    [provider]  metoprolol tartrate (LOPRESSOR) 50 MG tablet Take 1 tablet (50 mg total) by mouth 2 (two) times daily. 03/04/22   Jaynee Eagles, PA-C  Multiple Vitamins-Minerals (HAIR SKIN AND NAILS FORMULA PO) Take 1 tablet by mouth daily.    [provider]  Multiple Vitamins-Minerals (ONE-A-DAY WOMENS PO) Take 1 tablet by mouth daily at 6 (six) AM.    [provider]  naproxen (NAPROSYN) 500 MG tablet Take 1 tablet (500 mg total) by mouth 2 (two) times daily with a meal. 05/15/21   Jaynee Eagles, PA-C  QUEtiapine (SEROQUEL)  300 MG tablet Take 1 tablet (300 mg total) by mouth at bedtime as needed (sleep). 03/04/22   Jaynee Eagles, PA-C  amphetamine-dextroamphetamine (ADDERALL) 20 MG tablet Take 20 mg by mouth daily. 02/02/18 11/22/19  [provider]      Allergies    Amitriptyline, Cephalexin, Lithium, and Miconazole nitrate    Review of Systems   Review of Systems  Physical Exam Updated Vital Signs BP (!) 100/59 (BP Location: Left Arm)   Pulse 72   Temp 98.2 F (36.8 C) (Oral)   Resp 18   LMP 07/11/2022 (Approximate)   SpO2 99%  Physical Exam Vitals and nursing note reviewed.  Constitutional:      Appearance: She is well-developed.  HENT:     Head: Normocephalic and atraumatic.  Eyes:     Conjunctiva/sclera: Conjunctivae normal.  Pulmonary:     Effort: No respiratory distress.  Musculoskeletal:     Cervical back: Normal range of motion and neck supple.  Skin:    General: Skin is warm and dry.  Neurological:     Mental Status: She is alert.     ED Results / Procedures / Treatments   Labs (all labs ordered are listed, but only abnormal results are displayed) Labs Reviewed - No data to display  EKG None  Radiology No results found.  Procedures Procedures    Medications Ordered in ED Medications  methadone (DOLOPHINE) tablet 90 mg (has no administration in time range)    ED Course/ Medical Decision Making/ A&P    Patient seen and examined. History obtained directly from patient.  Reviewed ED note from yesterday.      Labs/EKG: None ordered Imaging: None ordered Medications/Fluids: Ordered: Methadone 90 mg p.o. x1  Most recent vital signs reviewed and are as follows: BP (!) 100/59 (BP Location: Left Arm)   Pulse 72   Temp 98.2 F (36.8 C) (Oral)   Resp 18   LMP 07/11/2022 (Approximate)   SpO2 99%   Initial impression: Normal exam, here for dose of methadone  Return instructions discussed with patient: As needed  Follow-up instructions discussed with patient:  Methadone clinic tomorrow                          Medical Decision Making Risk Prescription drug management.   Well-appearing patient in need of chronic methadone use to prevent withdrawal.        Final Clinical Impression(s) / ED Diagnoses Final diagnoses:  Medication refill    Rx / DC Orders ED Discharge Orders     None         Renne Crigler, PA-C 07/26/22 1728    Alvira Monday, MD 07/27/22 1144

## 2022-07-30 ENCOUNTER — Emergency Department (HOSPITAL_COMMUNITY)
Admission: EM | Admit: 2022-07-30 | Discharge: 2022-07-30 | Disposition: A | Discharge status: Home / Self Care | Payer: Self-pay | Attending: Emergency Medicine | Admitting: Emergency Medicine

## 2022-07-30 ENCOUNTER — Encounter (HOSPITAL_COMMUNITY): Payer: Self-pay | Admitting: Emergency Medicine

## 2022-07-30 ENCOUNTER — Emergency Department (HOSPITAL_COMMUNITY): Admission: EM | Admit: 2022-07-30 | Discharge: 2022-07-30 | Discharge status: Against Medical Advice | Payer: Self-pay

## 2022-07-30 ENCOUNTER — Other Ambulatory Visit: Payer: Self-pay

## 2022-07-30 DIAGNOSIS — Z7689 Persons encountering health services in other specified circumstances: Secondary | ICD-10-CM

## 2022-07-30 DIAGNOSIS — Z760 Encounter for issue of repeat prescription: Secondary | ICD-10-CM | POA: Insufficient documentation

## 2022-07-30 MED ORDER — METHADONE HCL 10 MG/ML PO CONC
110.0000 mg | Freq: Once | ORAL | Status: AC
  Administered 2022-07-30: 110 mg via ORAL
  Filled 2022-07-30 (×2): qty 15

## 2022-07-30 NOTE — ED Triage Notes (Signed)
Pt reports she needs her dose of methadone. Reports she tried to get it today & they told her to come here for dose.

## 2022-07-30 NOTE — Discharge Instructions (Signed)
Your good today 110 mg dose of your methadone today.  I did confirm your dose with your clinic.  Please follow-up with your methadone clinic to receive your dose tomorrow.

## 2022-07-30 NOTE — ED Provider Notes (Signed)
[place] DEPT Provider Note   CSN: [id number] Arrival date & time: 07/30/22  1220     History  Chief Complaint  Patient presents with   Medication Refill    Caitlin Berger is a 36 y.o. female.  36 year old female presents today for evaluation of methadone dose.  She states she missed her appointment today at the methadone clinic due to having to attend a meeting her children school.  Last dose was yesterday.  She takes 110 mg according to her.  Denies any acute complaints.  The history is provided by the patient. No language interpreter was used.       Home Medications Prior to Admission medications   Medication Sig Start Date End Date Taking? Authorizing Provider  acetaminophen (TYLENOL) 500 MG tablet Take 1,000 mg by mouth every 6 (six) hours as needed for moderate pain.    [provider]  diazePAM (VALIUM PO) Take 1 tablet by mouth in the morning and at bedtime. For thyroid tremors.    [provider]  hydrOXYzine (ATARAX) 25 MG tablet Take 0.5-1 tablets (12.5-25 mg total) by mouth every 8 (eight) hours as needed for itching. 03/04/22   Jaynee Eagles, PA-C  ibuprofen (ADVIL) 200 MG tablet Take 600 mg by mouth every 6 (six) hours as needed for moderate pain.    [provider]  metoprolol tartrate (LOPRESSOR) 50 MG tablet Take 1 tablet (50 mg total) by mouth 2 (two) times daily. 03/04/22   Jaynee Eagles, PA-C  Multiple Vitamins-Minerals (HAIR SKIN AND NAILS FORMULA PO) Take 1 tablet by mouth daily.    [provider]  Multiple Vitamins-Minerals (ONE-A-DAY WOMENS PO) Take 1 tablet by mouth daily at 6 (six) AM.    [provider]  naproxen (NAPROSYN) 500 MG tablet Take 1 tablet (500 mg total) by mouth 2 (two) times daily with a meal. 05/15/21   Jaynee Eagles, PA-C  QUEtiapine (SEROQUEL) 300 MG tablet Take 1 tablet (300 mg total) by mouth at bedtime as needed (sleep). 03/04/22   Jaynee Eagles, PA-C   amphetamine-dextroamphetamine (ADDERALL) 20 MG tablet Take 20 mg by mouth daily. 02/02/18 11/22/19  [provider]      Allergies    Amitriptyline, Cephalexin, Lithium, and Miconazole nitrate    Review of Systems   Review of Systems  Constitutional:  Negative for chills and fever.  Respiratory:  Negative for shortness of breath.   Cardiovascular:  Negative for chest pain.  Neurological:  Negative for light-headedness and headaches.  All other systems reviewed and are negative.   Physical Exam Updated Vital Signs BP 104/89   Pulse 73   Temp 98.2 F (36.8 C)   Resp 19   Ht 5\' 5"  (1.651 m)   Wt 56.7 kg   LMP 07/11/2022 (Approximate)   SpO2 100%   BMI 20.80 kg/m  Physical Exam Vitals and nursing note reviewed.  Constitutional:      General: She is not in acute distress.    Appearance: Normal appearance. She is not ill-appearing.  HENT:     Head: Normocephalic and atraumatic.     Nose: Nose normal.  Eyes:     Conjunctiva/sclera: Conjunctivae normal.  Cardiovascular:     Rate and Rhythm: Normal rate and regular rhythm.  Pulmonary:     Effort: Pulmonary effort is normal. No respiratory distress.     Breath sounds: Normal breath sounds. No wheezing or rales.  Musculoskeletal:        General: No  deformity. Normal range of motion.     Cervical back: Normal range of motion.  Skin:    Findings: No rash.  Neurological:     Mental Status: She is alert.     ED Results / Procedures / Treatments   Labs (all labs ordered are listed, but only abnormal results are displayed) Labs Reviewed - No data to display  EKG None  Radiology No results found.  Procedures Procedures    Medications Ordered in ED Medications  methadone (DOLOPHINE) 10 MG/ML solution 110 mg (has no administration in time range)    ED Course/ Medical Decision Making/ A&P                           Medical Decision Making Risk Prescription drug management.   36 year old female  presents for methadone dose after missing her dose this morning.  I did speak with Lattie Haw from [facility] in [place].  She confirmed patient's last dose was 10/25 of 110 mg.  They can from she takes 110 mg daily..  Will give patient dose of methadone 110 mg here today.  Discussed following up with her clinic for her ongoing doses.  Denies acute complaints.  Patient is appropriate for discharge.  Discharged in stable condition.  Return precautions discussed.   Final Clinical Impression(s) / ED Diagnoses Final diagnoses:  Encounter for medication administration    Rx / DC Orders ED Discharge Orders     None         Evlyn Courier, PA-C 07/30/22 1345    Davonna Belling, MD 07/30/22 1538

## 2022-11-01 ENCOUNTER — Other Ambulatory Visit: Payer: Self-pay

## 2022-11-01 ENCOUNTER — Emergency Department (HOSPITAL_COMMUNITY)
Admission: EM | Admit: 2022-11-01 | Discharge: 2022-11-01 | Disposition: A | Discharge status: Home / Self Care | Payer: Medicaid Other | Attending: Emergency Medicine | Admitting: Emergency Medicine

## 2022-11-01 ENCOUNTER — Encounter (HOSPITAL_COMMUNITY): Payer: Self-pay

## 2022-11-01 DIAGNOSIS — R000 Tachycardia, unspecified: Secondary | ICD-10-CM | POA: Insufficient documentation

## 2022-11-01 DIAGNOSIS — R197 Diarrhea, unspecified: Secondary | ICD-10-CM | POA: Diagnosis not present

## 2022-11-01 DIAGNOSIS — F1193 Opioid use, unspecified with withdrawal: Secondary | ICD-10-CM

## 2022-11-01 DIAGNOSIS — Z760 Encounter for issue of repeat prescription: Secondary | ICD-10-CM | POA: Diagnosis not present

## 2022-11-01 DIAGNOSIS — Z79899 Other long term (current) drug therapy: Secondary | ICD-10-CM | POA: Diagnosis not present

## 2022-11-01 DIAGNOSIS — R110 Nausea: Secondary | ICD-10-CM | POA: Diagnosis not present

## 2022-11-01 DIAGNOSIS — F1123 Opioid dependence with withdrawal: Secondary | ICD-10-CM | POA: Diagnosis not present

## 2022-11-01 MED ORDER — METHADONE HCL 10 MG PO TABS
180.0000 mg | ORAL_TABLET | Freq: Once | ORAL | Status: AC
  Administered 2022-11-01: 180 mg via ORAL
  Filled 2022-11-01: qty 18

## 2022-11-01 NOTE — ED Triage Notes (Signed)
Pt states she takes methadone, was unable to pick up medication when it was time. Came here for refill. Pt states she has prescription number and all necessary documentation

## 2022-11-01 NOTE — Discharge Instructions (Signed)
Follow up with your methadone clinic tomorrow.

## 2022-11-01 NOTE — ED Provider Notes (Signed)
[place] EMERGENCY DEPARTMENT AT [facility] Provider Note   CSN: [id number] Arrival date & time: 11/01/22  2237     History  Chief Complaint  Patient presents with   Medication Refill    Caitlin Berger is a 37 y.o. female.  Patient presents to the emergency department for evaluation of methadone withdrawal.  Patient goes to a methadone clinic.  She states that she accidentally overslept yesterday and did not make the window to pick up her weekend methadone.  Her last dose was on 10/30/2022.  She has not had methadone yesterday or today.  She was trying to make it through, however developed tachycardia, nausea and diarrhea.  Patient presents with her bottle from last weekend.  Confirms that she is supposed to take 180 mg of methadone daily.  She was seen several months ago in the ED after missing a dose.  Dose was lowered at that time.  She explains that during the course since this time her clinic has titrated her upward to a dose which controls her symptoms well.  Denies other complaints.       Home Medications Prior to Admission medications   Medication Sig Start Date End Date Taking? Authorizing Provider  acetaminophen (TYLENOL) 500 MG tablet Take 1,000 mg by mouth every 6 (six) hours as needed for moderate pain.    [provider]  diazePAM (VALIUM PO) Take 1 tablet by mouth in the morning and at bedtime. For thyroid tremors.    [provider]  hydrOXYzine (ATARAX) 25 MG tablet Take 0.5-1 tablets (12.5-25 mg total) by mouth every 8 (eight) hours as needed for itching. 03/04/22   Jaynee Eagles, PA-C  ibuprofen (ADVIL) 200 MG tablet Take 600 mg by mouth every 6 (six) hours as needed for moderate pain.    [provider]  methadone (DOLOPHINE) 10 MG/ML solution Take 110 mg by mouth daily.    [provider]  metoprolol tartrate (LOPRESSOR) 50 MG tablet Take 1 tablet (50 mg total) by mouth 2 (two) times daily. 03/04/22   Jaynee Eagles,  PA-C  Multiple Vitamins-Minerals (HAIR SKIN AND NAILS FORMULA PO) Take 1 tablet by mouth daily.    [provider]  Multiple Vitamins-Minerals (ONE-A-DAY WOMENS PO) Take 1 tablet by mouth daily at 6 (six) AM.    [provider]  naproxen (NAPROSYN) 500 MG tablet Take 1 tablet (500 mg total) by mouth 2 (two) times daily with a meal. 05/15/21   Jaynee Eagles, PA-C  QUEtiapine (SEROQUEL) 300 MG tablet Take 1 tablet (300 mg total) by mouth at bedtime as needed (sleep). 03/04/22   Jaynee Eagles, PA-C  amphetamine-dextroamphetamine (ADDERALL) 20 MG tablet Take 20 mg by mouth daily. 02/02/18 11/22/19  [provider]      Allergies    Amitriptyline, Cephalexin, Lithium, and Miconazole nitrate    Review of Systems   Review of Systems  Physical Exam Updated Vital Signs BP (!) 131/91 (BP Location: Right Arm)   Pulse (!) 118   Temp 98 F (36.7 C) (Oral)   Resp 18   Ht 5\' 5"  (1.651 m)   Wt 56.7 kg   SpO2 100%   BMI 20.80 kg/m   Physical Exam Vitals and nursing note reviewed.  Constitutional:      Appearance: She is well-developed.  HENT:     Head: Normocephalic and atraumatic.  Eyes:     Conjunctiva/sclera: Conjunctivae normal.  Cardiovascular:     Rate and Rhythm: Tachycardia present.  Pulmonary:     Effort: No respiratory distress.  Musculoskeletal:     Cervical back: Normal range of motion and neck supple.  Skin:    General: Skin is warm and dry.  Neurological:     Mental Status: She is alert.     ED Results / Procedures / Treatments   Labs (all labs ordered are listed, but only abnormal results are displayed) Labs Reviewed - No data to display  EKG None  Radiology No results found.  Procedures Procedures    Medications Ordered in ED Medications  methadone (DOLOPHINE) tablet 180 mg (has no administration in time range)    ED Course/ Medical Decision Making/ A&P    Patient seen and examined. History obtained directly from patient.    Labs/EKG: None ordered  Imaging: None ordered  Medications/Fluids: Methadone 180 mg x 1  Most recent vital signs reviewed and are as follows: BP (!) 131/91 (BP Location: Right Arm)   Pulse (!) 118   Temp 98 F (36.7 C) (Oral)   Resp 18   Ht 5\' 5"  (1.651 m)   Wt 56.7 kg   SpO2 100%   BMI 20.80 kg/m   Initial impression: Opioid withdrawal  Return instructions discussed with patient: Worsening or changing symptoms  Follow-up instructions discussed with patient: Continue clinic visits tomorrow for maintenance.                            Medical Decision Making Risk Prescription drug management.   Patient with mild opioid withdrawal after not having methadone for greater than 48 hours.  She is tachycardic but is not vomiting.  She has no complaints suggestive of infection, blood loss, pulmonary embolism or other concerns.  The patient's vital signs, pertinent lab work and imaging were reviewed and interpreted as discussed in the ED course. Hospitalization was considered for further testing, treatments, or serial exams/observation. However as patient is well-appearing, has a stable exam, and reassuring studies today, I do not feel that they warrant admission at this time. This plan was discussed with the patient who verbalizes agreement and comfort with this plan and seems reliable and able to return to the Emergency Department with worsening or changing symptoms.          Final Clinical Impression(s) / ED Diagnoses Final diagnoses:  Methadone withdrawal without complication [facility])    Rx / DC Orders ED Discharge Orders     None         Carlisle Cater, PA-C 11/01/22 2304    Molpus, Jenny Reichmann, MD 11/02/22 [phone]

## 2022-11-22 ENCOUNTER — Encounter (HOSPITAL_COMMUNITY): Payer: Self-pay

## 2022-11-22 ENCOUNTER — Emergency Department (HOSPITAL_COMMUNITY)
Admission: EM | Admit: 2022-11-22 | Discharge: 2022-11-22 | Disposition: A | Discharge status: Home / Self Care | Payer: Medicaid Other | Attending: Emergency Medicine | Admitting: Emergency Medicine

## 2022-11-22 ENCOUNTER — Other Ambulatory Visit: Payer: Self-pay

## 2022-11-22 DIAGNOSIS — Z760 Encounter for issue of repeat prescription: Secondary | ICD-10-CM | POA: Diagnosis present

## 2022-11-22 DIAGNOSIS — Z79899 Other long term (current) drug therapy: Secondary | ICD-10-CM

## 2022-11-22 MED ORDER — METHADONE HCL 10 MG/ML PO CONC
180.0000 mg | Freq: Once | ORAL | Status: AC
  Administered 2022-11-22: 180 mg via ORAL
  Filled 2022-11-22: qty 20

## 2022-11-22 NOTE — ED Triage Notes (Addendum)
Patient arrives requesting medication refill. Patient takes 162m of methadone. Patient missed her appointment time to receive the medication. [facility]

## 2022-11-22 NOTE — ED Provider Notes (Signed)
[place] EMERGENCY DEPARTMENT AT [facility] Provider Note   CSN: [id number] Arrival date & time: 11/22/22  0128     History  Chief Complaint  Patient presents with   Medication Refill    Caitlin Berger is a 37 y.o. female.  The history is provided by the patient and medical records.  Medication Refill  37 y.o. F here requesting dose of her methadone.  She states her son was involved in [facility] yesterday morning so she has been at the hospital with him and missed her methadone appt for Saturday morning for weekend dosing (Saturday in clinic, take home bottle for Sunday).  States clinic is not open on Sunday so did not want to wait until Monday for medications as she is trying to stay on track. No other missed doses.  She is being treated at [facility] since October 2023.    Home Medications Prior to Admission medications   Medication Sig Start Date End Date Taking? Authorizing Provider  acetaminophen (TYLENOL) 500 MG tablet Take 1,000 mg by mouth every 6 (six) hours as needed for moderate pain.    [provider]  diazePAM (VALIUM PO) Take 1 tablet by mouth in the morning and at bedtime. For thyroid tremors.    [provider]  hydrOXYzine (ATARAX) 25 MG tablet Take 0.5-1 tablets (12.5-25 mg total) by mouth every 8 (eight) hours as needed for itching. 03/04/22   Jaynee Eagles, PA-C  ibuprofen (ADVIL) 200 MG tablet Take 600 mg by mouth every 6 (six) hours as needed for moderate pain.    [provider]  methadone (DOLOPHINE) 10 MG/ML solution Take 110 mg by mouth daily.    [provider]  metoprolol tartrate (LOPRESSOR) 50 MG tablet Take 1 tablet (50 mg total) by mouth 2 (two) times daily. 03/04/22   Jaynee Eagles, PA-C  Multiple Vitamins-Minerals (HAIR SKIN AND NAILS FORMULA PO) Take 1 tablet by mouth daily.    [provider]  Multiple Vitamins-Minerals (ONE-A-DAY WOMENS PO) Take 1 tablet by mouth daily at 6 (six)  AM.    [provider]  naproxen (NAPROSYN) 500 MG tablet Take 1 tablet (500 mg total) by mouth 2 (two) times daily with a meal. 05/15/21   Jaynee Eagles, PA-C  QUEtiapine (SEROQUEL) 300 MG tablet Take 1 tablet (300 mg total) by mouth at bedtime as needed (sleep). 03/04/22   Jaynee Eagles, PA-C  amphetamine-dextroamphetamine (ADDERALL) 20 MG tablet Take 20 mg by mouth daily. 02/02/18 11/22/19  [provider]      Allergies    Amitriptyline, Cephalexin, Lithium, and Miconazole nitrate    Review of Systems   Review of Systems  Constitutional:        Medications  All other systems reviewed and are negative.   Physical Exam Updated Vital Signs BP 123/72 (BP Location: Right Arm)   Pulse (!) 105   Temp 97.9 F (36.6 C) (Oral)   Resp 18   Ht 5' 5"$  (1.651 m)   Wt 56.7 kg   SpO2 97%   BMI 20.80 kg/m   Physical Exam Vitals and nursing note reviewed.  Constitutional:      Appearance: She is well-developed.  HENT:     Head: Normocephalic and atraumatic.  Eyes:     Conjunctiva/sclera: Conjunctivae normal.     Pupils: Pupils are equal, round, and reactive to light.  Cardiovascular:     Rate and Rhythm: Normal rate and regular rhythm.  Heart sounds: Normal heart sounds.  Pulmonary:     Effort: Pulmonary effort is normal.     Breath sounds: Normal breath sounds.  Abdominal:     General: Bowel sounds are normal.     Palpations: Abdomen is soft.  Musculoskeletal:        General: Normal range of motion.     Cervical back: Normal range of motion.  Skin:    General: Skin is warm and dry.  Neurological:     Mental Status: She is alert and oriented to person, place, and time.     ED Results / Procedures / Treatments   Labs (all labs ordered are listed, but only abnormal results are displayed) Labs Reviewed - No data to display  EKG None  Radiology No results found.  Procedures Procedures    Medications Ordered in ED Medications  methadone (DOLOPHINE) 10  MG/ML solution 180 mg (180 mg Oral Given 11/22/22 0221)    ED Course/ Medical Decision Making/ A&P                             Medical Decision Making Risk Prescription drug management.   37 y.o. F presenting to the ED for dose of methadone.  Missed clinic appt yesterday as she was in the hospital with her son.  She is able to show me her take home bottle from clinic last Sunday, dosage is 113m.  Her vitals are stable.  Feel it is reasonable for one time dose here, then can continue medication at clinics per usual tomorrow morning.  Can follow-up with PCP.  Can return here for new concerns.  Final Clinical Impression(s) / ED Diagnoses Final diagnoses:  Medication management    Rx / DC Orders ED Discharge Orders     None         SLarene Pickett PA-C 11/22/22 0228    REzequiel Essex MD 11/22/22 [phone]

## 2022-12-28 ENCOUNTER — Other Ambulatory Visit: Payer: Self-pay

## 2022-12-28 ENCOUNTER — Emergency Department (HOSPITAL_COMMUNITY)
Admission: EM | Admit: 2022-12-28 | Discharge: 2022-12-28 | Disposition: A | Discharge status: Home / Self Care | Payer: Medicaid Other | Attending: Emergency Medicine | Admitting: Emergency Medicine

## 2022-12-28 DIAGNOSIS — Z760 Encounter for issue of repeat prescription: Secondary | ICD-10-CM

## 2022-12-28 DIAGNOSIS — F1123 Opioid dependence with withdrawal: Secondary | ICD-10-CM | POA: Diagnosis not present

## 2022-12-28 DIAGNOSIS — F1193 Opioid use, unspecified with withdrawal: Secondary | ICD-10-CM

## 2022-12-28 MED ORDER — METHADONE HCL 10 MG/ML PO CONC
180.0000 mg | Freq: Once | ORAL | Status: AC
  Administered 2022-12-28: 180 mg via ORAL
  Filled 2022-12-28: qty 20

## 2022-12-28 MED ORDER — METHADONE HCL 10 MG PO TABS: 180.0000 mg | ORAL_TABLET | Freq: Once | ORAL | Status: DC

## 2022-12-28 NOTE — ED Provider Notes (Signed)
[place] EMERGENCY DEPARTMENT AT [facility] Provider Note   CSN: [id number] Arrival date & time: 12/28/22  1916     History  No chief complaint on file.   Caitlin Berger is a 37 y.o. female with past medical history significant for ADHD, bipolar, anxiety, history of opioid abuse who presents with concern for missing her appointment at the methadone clinic this morning, and began to experience withdrawal symptoms.  Patient reports some nausea, body aches, reports that her current dose is 180 mg.  Recent emergency department visits suggest that this is back.  Current dosage.  HPI     Home Medications Prior to Admission medications   Medication Sig Start Date End Date Taking? Authorizing Provider  acetaminophen (TYLENOL) 500 MG tablet Take 1,000 mg by mouth every 6 (six) hours as needed for moderate pain.    [provider]  diazePAM (VALIUM PO) Take 1 tablet by mouth in the morning and at bedtime. For thyroid tremors.    [provider]  hydrOXYzine (ATARAX) 25 MG tablet Take 0.5-1 tablets (12.5-25 mg total) by mouth every 8 (eight) hours as needed for itching. 03/04/22   Jaynee Eagles, PA-C  ibuprofen (ADVIL) 200 MG tablet Take 600 mg by mouth every 6 (six) hours as needed for moderate pain.    [provider]  methadone (DOLOPHINE) 10 MG/ML solution Take 110 mg by mouth daily.    [provider]  metoprolol tartrate (LOPRESSOR) 50 MG tablet Take 1 tablet (50 mg total) by mouth 2 (two) times daily. 03/04/22   Jaynee Eagles, PA-C  Multiple Vitamins-Minerals (HAIR SKIN AND NAILS FORMULA PO) Take 1 tablet by mouth daily.    [provider]  Multiple Vitamins-Minerals (ONE-A-DAY WOMENS PO) Take 1 tablet by mouth daily at 6 (six) AM.    [provider]  naproxen (NAPROSYN) 500 MG tablet Take 1 tablet (500 mg total) by mouth 2 (two) times daily with a meal. 05/15/21   Jaynee Eagles, PA-C  QUEtiapine (SEROQUEL) 300 MG tablet Take 1  tablet (300 mg total) by mouth at bedtime as needed (sleep). 03/04/22   Jaynee Eagles, PA-C  amphetamine-dextroamphetamine (ADDERALL) 20 MG tablet Take 20 mg by mouth daily. 02/02/18 11/22/19  [provider]      Allergies    Amitriptyline, Cephalexin, Lithium, and Miconazole nitrate    Review of Systems   Review of Systems  All other systems reviewed and are negative.   Physical Exam Updated Vital Signs There were no vitals taken for this visit. Physical Exam Vitals and nursing note reviewed.  Constitutional:      General: She is not in acute distress.    Appearance: Normal appearance.  HENT:     Head: Normocephalic and atraumatic.  Eyes:     General:        Right eye: No discharge.        Left eye: No discharge.  Cardiovascular:     Rate and Rhythm: Normal rate and regular rhythm.  Pulmonary:     Effort: Pulmonary effort is normal. No respiratory distress.  Musculoskeletal:        General: No deformity.  Skin:    General: Skin is warm and dry.  Neurological:     Mental Status: She is alert and oriented to person, place, and time.  Psychiatric:        Mood and Affect: Mood normal.        Behavior: Behavior normal.     ED  Results / Procedures / Treatments   Labs (all labs ordered are listed, but only abnormal results are displayed) Labs Reviewed - No data to display  EKG None  Radiology No results found.  Procedures Procedures    Medications Ordered in ED Medications  methadone (DOLOPHINE) tablet 180 mg (has no administration in time range)    ED Course/ Medical Decision Making/ A&P                             Medical Decision Making Risk Prescription drug management.   This patient is a 37 y.o. female who presents to the ED for concern of missed doses of methadone, with chronic opioid dependence, she endorses some nausea, body aches after missing dose.   Differential diagnoses prior to evaluation: Opioid withdrawal versus drug-seeking  behavior versus other  Past Medical History / Social History / Additional history: Chart reviewed. Pertinent results include: I thoroughly reviewed patient's previous emergency department visits, this will be her 3 time for a missed dose of methadone in the last 3 months, but prior to this she is generally without significant frequent emergency department visits other than 3 times last October.   Physical Exam: Physical exam performed. The pertinent findings include: Patient is not in significant distress, she appears mildly diaphoretic but without pinpoint pupils, she is pleasant, responsive to questions  Medications / Treatment: Discussed with patient that it is not a consistent and reasonable request to present to the emergency department for anytime she misses a dose of her methadone, discussed that we we will provide her her missed dose today, but in the future providers may be more stringent especially if patient's emergency department visits maintain this regularity   Disposition: After consideration of the diagnostic results and the patients response to treatment, I feel that patient stable for discharge with opioid withdrawal symptoms, she received methadone and is discharged in stable condition back to her methadone clinic.   emergency department workup does not suggest an emergent condition requiring admission or immediate intervention beyond what has been performed at this time. The plan is: as above. The patient is safe for discharge and has been instructed to return immediately for worsening symptoms, change in symptoms or any other concerns.  Final Clinical Impression(s) / ED Diagnoses Final diagnoses:  Opioid withdrawal [facility])  Medication refill    Rx / DC Orders ED Discharge Orders     None         Caitlin Berger 12/28/22 2011    Caitlin Lemming, MD 12/28/22 2055

## 2022-12-28 NOTE — ED Triage Notes (Signed)
Missed appt at [facility] clinic this AM and needs dose of methodone. Last dose of methodone was Friday. Feeling some withdrawal symptoms - shaky and fever/chills. Pt A&Ox4, VSS.

## 2022-12-29 ENCOUNTER — Encounter (HOSPITAL_COMMUNITY): Payer: Self-pay

## 2022-12-29 ENCOUNTER — Emergency Department (HOSPITAL_COMMUNITY): Payer: Medicaid Other

## 2022-12-29 ENCOUNTER — Emergency Department (HOSPITAL_COMMUNITY)
Admission: EM | Admit: 2022-12-29 | Discharge: 2022-12-29 | Disposition: A | Discharge status: Home / Self Care | Payer: Medicaid Other | Attending: Emergency Medicine | Admitting: Emergency Medicine

## 2022-12-29 DIAGNOSIS — D72829 Elevated white blood cell count, unspecified: Secondary | ICD-10-CM | POA: Insufficient documentation

## 2022-12-29 DIAGNOSIS — R300 Dysuria: Secondary | ICD-10-CM | POA: Insufficient documentation

## 2022-12-29 DIAGNOSIS — Z79899 Other long term (current) drug therapy: Secondary | ICD-10-CM | POA: Diagnosis not present

## 2022-12-29 DIAGNOSIS — R109 Unspecified abdominal pain: Secondary | ICD-10-CM | POA: Diagnosis present

## 2022-12-29 DIAGNOSIS — N12 Tubulo-interstitial nephritis, not specified as acute or chronic: Secondary | ICD-10-CM | POA: Insufficient documentation

## 2022-12-29 HISTORY — DX: Bipolar disorder, unspecified: F31.9

## 2022-12-29 LAB — RAPID URINE DRUG SCREEN, HOSP PERFORMED
Amphetamines: POSITIVE — AB
Barbiturates: NOT DETECTED
Benzodiazepines: NOT DETECTED
Cocaine: POSITIVE — AB
Opiates: NOT DETECTED
Tetrahydrocannabinol: POSITIVE — AB

## 2022-12-29 LAB — BASIC METABOLIC PANEL
Anion gap: 9 (ref 5–15)
BUN: 7 mg/dL (ref 6–20)
CO2: 24 mmol/L (ref 22–32)
Calcium: 8.4 mg/dL — ABNORMAL LOW (ref 8.9–10.3)
Chloride: 102 mmol/L (ref 98–111)
Creatinine, Ser: 0.6 mg/dL (ref 0.44–1.00)
GFR, Estimated: >60 mL/min (ref >60)
Glucose, Bld: 70 mg/dL (ref 70–99)
Potassium: 3.3 mmol/L — ABNORMAL LOW (ref 3.5–5.1)
Sodium: 135 mmol/L (ref 135–145)

## 2022-12-29 LAB — URINALYSIS, ROUTINE W REFLEX MICROSCOPIC
Bilirubin Urine: NEGATIVE
Glucose, UA: NEGATIVE mg/dL
Ketones, ur: NEGATIVE mg/dL
Nitrite: POSITIVE — AB
Protein, ur: 30 mg/dL — AB
Specific Gravity, Urine: 1.012 (ref 1.005–1.030)
WBC, UA: >50 WBC/hpf (ref 0–5)
pH: 6 (ref 5.0–8.0)

## 2022-12-29 LAB — CBC
HCT: 36.9 % (ref 36.0–46.0)
Hemoglobin: 12 g/dL (ref 12.0–15.0)
MCH: 31.3 pg (ref 26.0–34.0)
MCHC: 32.5 g/dL (ref 30.0–36.0)
MCV: 96.1 fL (ref 80.0–100.0)
Platelets: 284 10*3/uL (ref 150–400)
RBC: 3.84 MIL/uL — ABNORMAL LOW (ref 3.87–5.11)
RDW: 14.3 % (ref 11.5–15.5)
WBC: 16.9 10*3/uL — ABNORMAL HIGH (ref 4.0–10.5)
nRBC: 0 % (ref 0.0–0.2)

## 2022-12-29 LAB — ETHANOL: Alcohol, Ethyl (B): <10 mg/dL (ref <10)

## 2022-12-29 LAB — HCG, QUANTITATIVE, PREGNANCY: hCG, Beta Chain, Quant, S: <1 m[IU]/mL (ref <5)

## 2022-12-29 MED ORDER — KETOROLAC TROMETHAMINE 15 MG/ML IJ SOLN
15.0000 mg | Freq: Once | INTRAMUSCULAR | Status: AC
Start: 2022-12-29 — End: 2022-12-29
  Administered 2022-12-29: 15 mg via INTRAVENOUS
  Filled 2022-12-29: qty 1

## 2022-12-29 MED ORDER — CIPROFLOXACIN IN D5W 400 MG/200ML IV SOLN
400.0000 mg | Freq: Once | INTRAVENOUS | Status: AC
  Administered 2022-12-29: 400 mg via INTRAVENOUS
  Filled 2022-12-29: qty 200

## 2022-12-29 MED ORDER — ONDANSETRON HCL 4 MG/2ML IJ SOLN
4.0000 mg | Freq: Once | INTRAMUSCULAR | Status: AC
Start: 2022-12-29 — End: 2022-12-29
  Administered 2022-12-29: 4 mg via INTRAVENOUS
  Filled 2022-12-29: qty 2

## 2022-12-29 MED ORDER — METHADONE HCL 10 MG/ML PO CONC
180.0000 mg | ORAL | Status: AC
  Administered 2022-12-29: 180 mg via ORAL
  Filled 2022-12-29: qty 20

## 2022-12-29 MED ORDER — CIPROFLOXACIN HCL 500 MG PO TABS: 500.0000 mg | ORAL_TABLET | Freq: Two times a day (BID) | ORAL | 0 refills | Status: AC

## 2022-12-29 MED ORDER — ACETAMINOPHEN 500 MG PO TABS
1000.0000 mg | ORAL_TABLET | ORAL | Status: AC
  Administered 2022-12-29: 1000 mg via ORAL
  Filled 2022-12-29: qty 2

## 2022-12-29 NOTE — ED Triage Notes (Signed)
BIBA from home for kidney pain starting yesterday, getting worse, pain on palpation, generalized body aches. Pt also reports that she takes 100 mg Methadone daily - was not able to get to the clinic to get her dose today. 116/84 106 18 rr 99%

## 2022-12-29 NOTE — Discharge Instructions (Signed)
You were seen for your kidney infection (pyelonephritis) in the emergency department.   At home, please take the antibiotics (ciprofloxacin) to prevent worsening infection.    Check your MyChart online for the results of any tests that had not resulted by the time you left the emergency department.   Follow-up with your primary doctor in 2-3 days regarding your visit.    Return immediately to the emergency department if you experience any of the following: fevers, or any other concerning symptoms.    Thank you for visiting our Emergency Department. It was a pleasure taking care of you today.

## 2022-12-29 NOTE — ED Notes (Signed)
Patient transported to CT 

## 2022-12-29 NOTE — ED Provider Notes (Signed)
[place] Provider Note   CSN: [id number] Arrival date & time: 12/29/22  1814     History  Chief Complaint  Patient presents with   Flank Pain    Caitlin Berger is a 37 y.o. female.  37 year old female with a history of opiate abuse on methadone who presents emergency department with flank pain.  Patient reports that last night she started experiencing right flank pain.  Says that it persisted and got worse today so she decided to come into the emergency department.  Also having dysuria with foul-smelling urine and reports a history of pyelonephritis.  Missed her methadone appointment today and says she has been feeling poorly since then with chills.  No vomiting.  Denies any illicit substance use or alcohol use prior to arrival.       Home Medications Prior to Admission medications   Medication Sig Start Date End Date Taking? Authorizing Provider  ciprofloxacin (CIPRO) 500 MG tablet Take 1 tablet (500 mg total) by mouth every 12 (twelve) hours for 7 days. 12/29/22 01/05/23 Yes Fransico Meadow, MD  acetaminophen (TYLENOL) 500 MG tablet Take 1,000 mg by mouth every 6 (six) hours as needed for moderate pain.    [provider]  diazePAM (VALIUM PO) Take 1 tablet by mouth in the morning and at bedtime. For thyroid tremors.    [provider]  hydrOXYzine (ATARAX) 25 MG tablet Take 0.5-1 tablets (12.5-25 mg total) by mouth every 8 (eight) hours as needed for itching. 03/04/22   Jaynee Eagles, PA-C  ibuprofen (ADVIL) 200 MG tablet Take 600 mg by mouth every 6 (six) hours as needed for moderate pain.    [provider]  methadone (DOLOPHINE) 10 MG/ML solution Take 110 mg by mouth daily.    [provider]  metoprolol tartrate (LOPRESSOR) 50 MG tablet Take 1 tablet (50 mg total) by mouth 2 (two) times daily. 03/04/22   Jaynee Eagles, PA-C  Multiple Vitamins-Minerals (HAIR SKIN AND NAILS FORMULA PO) Take 1  tablet by mouth daily.    [provider]  Multiple Vitamins-Minerals (ONE-A-DAY WOMENS PO) Take 1 tablet by mouth daily at 6 (six) AM.    [provider]  naproxen (NAPROSYN) 500 MG tablet Take 1 tablet (500 mg total) by mouth 2 (two) times daily with a meal. 05/15/21   Jaynee Eagles, PA-C  QUEtiapine (SEROQUEL) 300 MG tablet Take 1 tablet (300 mg total) by mouth at bedtime as needed (sleep). 03/04/22   Jaynee Eagles, PA-C  amphetamine-dextroamphetamine (ADDERALL) 20 MG tablet Take 20 mg by mouth daily. 02/02/18 11/22/19  [provider]      Allergies    Amitriptyline, Cephalexin, Lithium, and Miconazole nitrate    Review of Systems   Review of Systems  Physical Exam Updated Vital Signs BP 109/71   Pulse 70   Temp 98.3 F (36.8 C)   Resp 17   SpO2 98%  Physical Exam Vitals and nursing note reviewed.  Constitutional:      General: She is not in acute distress.    Appearance: She is well-developed.     Comments: Drowsy on exam  HENT:     Head: Normocephalic and atraumatic.     Right Ear: External ear normal.     Left Ear: External ear normal.     Nose: Nose normal.  Eyes:     Extraocular Movements: Extraocular movements intact.     Conjunctiva/sclera: Conjunctivae normal.  Pupils: Pupils are equal, round, and reactive to light.  Cardiovascular:     Rate and Rhythm: Normal rate and regular rhythm.  Abdominal:     General: Abdomen is flat. There is no distension.     Palpations: Abdomen is soft. There is no mass.     Tenderness: There is no abdominal tenderness. There is right CVA tenderness. There is no left CVA tenderness or guarding.  Musculoskeletal:     Cervical back: Normal range of motion and neck supple.  Skin:    General: Skin is warm and dry.  Neurological:     Mental Status: She is oriented to person, place, and time. Mental status is at baseline.  Psychiatric:        Mood and Affect: Mood normal.     ED Results / Procedures /  Treatments   Labs (all labs ordered are listed, but only abnormal results are displayed) Labs Reviewed  BASIC METABOLIC PANEL - Abnormal; Notable for the following components:      Result Value   Potassium 3.3 (*)    Calcium 8.4 (*)    All other components within normal limits  CBC - Abnormal; Notable for the following components:   WBC 16.9 (*)    RBC 3.84 (*)    All other components within normal limits  URINALYSIS, ROUTINE W REFLEX MICROSCOPIC - Abnormal; Notable for the following components:   APPearance CLOUDY (*)    Hgb urine dipstick SMALL (*)    Protein, ur 30 (*)    Nitrite POSITIVE (*)    Leukocytes,Ua MODERATE (*)    Bacteria, UA FEW (*)    All other components within normal limits  RAPID URINE DRUG SCREEN, HOSP PERFORMED - Abnormal; Notable for the following components:   Cocaine POSITIVE (*)    Amphetamines POSITIVE (*)    Tetrahydrocannabinol POSITIVE (*)    All other components within normal limits  URINE CULTURE  ETHANOL  HCG, QUANTITATIVE, PREGNANCY  I-STAT BETA HCG BLOOD, ED ([facility], [facility], [facility] ONLY)    EKG None  Radiology CT RENAL STONE STUDY  Result Date: 12/29/2022 CLINICAL DATA:  Right-sided flank pain for 2 days EXAM: CT ABDOMEN AND PELVIS WITHOUT CONTRAST TECHNIQUE: Multidetector CT imaging of the abdomen and pelvis was performed following the standard protocol without IV contrast. RADIATION DOSE REDUCTION: This exam was performed according to the departmental dose-optimization program which includes automated exposure control, adjustment of the mA and/or kV according to patient size and/or use of iterative reconstruction technique. COMPARISON:  None Available. FINDINGS: Lower chest: No acute abnormality. Hepatobiliary: No focal liver abnormality is seen. No gallstones, gallbladder wall thickening, or biliary dilatation. Pancreas: Unremarkable. No pancreatic ductal dilatation or surrounding inflammatory changes. Spleen: Normal in size without focal abnormality.  Adrenals/Urinary Tract: Adrenal glands are within normal limits. Left kidney is unremarkable. Right kidney is somewhat indistinct with mild perinephric stranding. No obstructive changes or renal calculi seen. Stranding is noted surrounding the ureter. The bladder is decompressed. Stomach/Bowel: Fecal material is noted throughout the colon. No obstructive or inflammatory changes are seen. The appendix is within normal limits. Small bowel and stomach are unremarkable. Vascular/Lymphatic: Aortic atherosclerosis. No enlarged abdominal or pelvic lymph nodes. Reproductive: Uterus and bilateral adnexa are unremarkable. Other: No abdominal wall hernia or abnormality. No abdominopelvic ascites. Musculoskeletal: No acute or significant osseous findings. IMPRESSION: Inflammatory changes surrounding the right kidney and right collecting system and ureter suspicious for underlying UTI and possible pyelonephritis. No obstructive changes are seen. Electronically Signed  By: Inez Catalina M.D.   On: 12/29/2022 22:08    Procedures Procedures   Medications Ordered in ED Medications  ciprofloxacin (CIPRO) IVPB 400 mg (400 mg Intravenous New Bag/Given 12/29/22 2210)  methadone (DOLOPHINE) 10 MG/ML solution 180 mg (has no administration in time range)  ketorolac (TORADOL) 15 MG/ML injection 15 mg (15 mg Intravenous Given 12/29/22 1920)  acetaminophen (TYLENOL) tablet 1,000 mg (1,000 mg Oral Given 12/29/22 1920)  ondansetron (ZOFRAN) injection 4 mg (4 mg Intravenous Given 12/29/22 1920)    ED Course/ Medical Decision Making/ A&P Clinical Course as of 12/29/22 2256  Tue Dec 29, 2022  2211 CT shows possible pyelonephritis.  [RP]    Clinical Course User Index [RP] Fransico Meadow, MD                            Medical Decision Making Amount and/or Complexity of Data Reviewed Labs: ordered. Radiology: ordered.  Risk OTC drugs. Prescription drug management.   SMRITI HILLS is a 37 y.o. female with  comorbidities that complicate the patient evaluation including opiate abuse on methadone who presents emergency department with flank pain and dysuria  Initial Ddx:  Pyelonephritis, infected kidney stone, opioid withdrawal  MDM:  Feel the patient likely has pyelonephritis given her symptoms.  With her flank pain could potentially also have an infected kidney stone so we will obtain a CT scan at this time.  Does appear to be having opiate withdrawal so we will treat her symptoms with NSAIDs and Tylenol and antiemetics.  Patient did seem drowsy on arrival also concerned about other illicit drugs so we will obtain urine drug screen and alcohol level at this time.  Plan:  Labs Ethanol Urinalysis Urine drug screen CT stone protocol Toradol Tylenol Zofran  ED Summary/Re-evaluation:  Patient's above workup showed that she had pyelonephritis.  CT scan without evidence of kidney stone.  Did have leukocytosis of 16.9 but remained afebrile with normal vital signs in the emergency department.  Given a dose of IV ciprofloxacin based on her prior cultures that grew E. coli and Klebsiella that was susceptible to it along with her allergies to cephalosporins.  Will be given a prescription for this at home and pursue outpatient treatment since she is not septic.  Her urine drug screen was positive for cocaine, amphetamines, and THC so suspect the patient may have been intoxicated on arrival but appears clinically sober at this time.  Did confirm her methadone dose with her clinic and we will give her a dose here.  TOC consult placed in case of any difficulties affording medications  This patient presents to the ED for concern of complaints listed in HPI, this involves an extensive number of treatment options, and is a complaint that carries with it a high risk of complications and morbidity. Disposition including potential need for admission considered.   Dispo: DC Home. Return precautions discussed  including, but not limited to, those listed in the AVS. Allowed pt time to ask questions which were answered fully prior to dc.  Records reviewed Outpatient Clinic Notes The following labs were independently interpreted: Urinalysis and show urinary tract infection I independently reviewed the following imaging with scope of interpretation limited to determining acute life threatening conditions related to emergency care: CT Abdomen/Pelvis and agree with the radiologist interpretation with the following exceptions: none I personally reviewed and interpreted cardiac monitoring: normal sinus rhythm  I personally reviewed and interpreted the pt's  EKG: see above for interpretation  I have reviewed the patients home medications and made adjustments as needed Social Determinants of health:  Opiate abuse  Final Clinical Impression(s) / ED Diagnoses Final diagnoses:  Pyelonephritis  Flank pain    Rx / DC Orders ED Discharge Orders          Ordered    ciprofloxacin (CIPRO) 500 MG tablet  Every 12 hours        12/29/22 2234              Fransico Meadow, MD 12/29/22 2257

## 2022-12-31 LAB — URINE CULTURE: Culture: >=100000 — AB

## 2023-01-01 ENCOUNTER — Telehealth (HOSPITAL_BASED_OUTPATIENT_CLINIC_OR_DEPARTMENT_OTHER): Payer: Self-pay

## 2023-01-01 NOTE — Telephone Encounter (Signed)
Post ED Visit - Positive Culture Follow-up  Culture report reviewed by antimicrobial stewardship pharmacist: [place] Team [x]  Elenor Quinones, Pharm.D. []  Heide Guile, Pharm.D., BCPS AQ-ID []  Parks Neptune, Pharm.D., BCPS []  Alycia Rossetti, Pharm.D., BCPS []  Camp Barrett, Pharm.D., BCPS, AAHIVP []  Legrand Como, Pharm.D., BCPS, AAHIVP []  Salome Arnt, PharmD, BCPS []  Johnnette Gourd, PharmD, BCPS []  Hughes Better, PharmD, BCPS []  Leeroy Cha, PharmD []  Laqueta Linden, PharmD, BCPS []  Albertina Parr, PharmD  [place] Team []  Leodis Sias, PharmD []  Lindell Spar, PharmD []  Royetta Asal, PharmD []  Graylin Shiver, Rph []  Rema Fendt) Glennon Mac, PharmD []  Arlyn Dunning, PharmD []  Netta Cedars, PharmD []  Dia Sitter, PharmD []  Leone Haven, PharmD []  Gretta Arab, PharmD []  Theodis Shove, PharmD []  Peggyann Juba, PharmD []  Reuel Boom, PharmD   Positive Urine culture Treated with Ciprofloxacin, organism sensitive to the same and no further patient follow-up is required at this time.  Glennon Hamilton 01/01/2023, 10:23 AM

## 2023-04-02 ENCOUNTER — Encounter (HOSPITAL_COMMUNITY): Payer: Self-pay | Admitting: Emergency Medicine

## 2023-04-02 ENCOUNTER — Emergency Department (HOSPITAL_COMMUNITY)
Admission: EM | Admit: 2023-04-02 | Discharge: 2023-04-02 | Discharge status: Against Medical Advice | Payer: Medicaid Other | Attending: Emergency Medicine | Admitting: Emergency Medicine

## 2023-04-02 DIAGNOSIS — K0889 Other specified disorders of teeth and supporting structures: Secondary | ICD-10-CM | POA: Diagnosis present

## 2023-04-02 DIAGNOSIS — Z5321 Procedure and treatment not carried out due to patient leaving prior to being seen by health care provider: Secondary | ICD-10-CM | POA: Diagnosis not present

## 2023-04-02 NOTE — ED Triage Notes (Addendum)
Tooth pain starting today from chipped tooth and missed methadone dose today. Tearful and crying in triage. No swelling noted.

## 2023-04-16 ENCOUNTER — Other Ambulatory Visit: Payer: Self-pay

## 2023-04-16 ENCOUNTER — Emergency Department (HOSPITAL_COMMUNITY)
Admission: EM | Admit: 2023-04-16 | Discharge: 2023-04-16 | Disposition: A | Discharge status: Home / Self Care | Payer: MEDICAID | Attending: Emergency Medicine | Admitting: Emergency Medicine

## 2023-04-16 DIAGNOSIS — F1190 Opioid use, unspecified, uncomplicated: Secondary | ICD-10-CM | POA: Insufficient documentation

## 2023-04-16 DIAGNOSIS — K029 Dental caries, unspecified: Secondary | ICD-10-CM | POA: Insufficient documentation

## 2023-04-16 DIAGNOSIS — K0889 Other specified disorders of teeth and supporting structures: Secondary | ICD-10-CM | POA: Diagnosis present

## 2023-04-16 MED ORDER — CLONIDINE HCL 0.1 MG PO TABS
0.1000 mg | ORAL_TABLET | Freq: Once | ORAL | Status: AC
  Administered 2023-04-16: 0.1 mg via ORAL
  Filled 2023-04-16: qty 1

## 2023-04-16 MED ORDER — ONDANSETRON HCL 4 MG PO TABS: 4.0000 mg | ORAL_TABLET | Freq: Four times a day (QID) | ORAL | 0 refills | Status: DC

## 2023-04-16 MED ORDER — AMOXICILLIN 500 MG PO CAPS: 500.0000 mg | ORAL_CAPSULE | Freq: Three times a day (TID) | ORAL | 0 refills | Status: DC

## 2023-04-16 MED ORDER — DICYCLOMINE HCL 20 MG PO TABS: 20.0000 mg | ORAL_TABLET | Freq: Two times a day (BID) | ORAL | 0 refills | Status: DC

## 2023-04-16 MED ORDER — DICYCLOMINE HCL 20 MG PO TABS
20.0000 mg | ORAL_TABLET | Freq: Four times a day (QID) | ORAL | Status: DC | PRN
  Administered 2023-04-16: 20 mg via ORAL
  Filled 2023-04-16: qty 1

## 2023-04-16 MED ORDER — KETOROLAC TROMETHAMINE 30 MG/ML IJ SOLN
30.0000 mg | Freq: Once | INTRAMUSCULAR | Status: AC
  Administered 2023-04-16: 30 mg via INTRAMUSCULAR

## 2023-04-16 MED ORDER — KETOROLAC TROMETHAMINE 30 MG/ML IJ SOLN
30.0000 mg | Freq: Once | INTRAMUSCULAR | Status: DC
  Filled 2023-04-16: qty 1

## 2023-04-16 MED ORDER — METHOCARBAMOL 500 MG PO TABS: 500.0000 mg | ORAL_TABLET | Freq: Two times a day (BID) | ORAL | 0 refills | Status: DC

## 2023-04-16 MED ORDER — ONDANSETRON 4 MG PO TBDP
4.0000 mg | ORAL_TABLET | Freq: Four times a day (QID) | ORAL | Status: DC | PRN
  Administered 2023-04-16: 4 mg via ORAL
  Filled 2023-04-16: qty 1

## 2023-04-16 MED ORDER — NAPROXEN 500 MG PO TABS
500.0000 mg | ORAL_TABLET | Freq: Two times a day (BID) | ORAL | Status: DC | PRN
  Filled 2023-04-16: qty 1

## 2023-04-16 NOTE — ED Provider Notes (Signed)
[place] EMERGENCY DEPARTMENT AT [facility] Provider Note   CSN: [id number] Arrival date & time: 04/16/23  1441     History  Chief Complaint  Patient presents with   Medication Refill    Caitlin Berger is a 37 y.o. female.   Medication Refill  Pt presents to the ED requesting a dose of methadone.  Pt states she was out of town and missed her dose today.  Pt states she was told to come to the ED.  Pt complains of nausea and chills.    Also complains of a toothache.    Prior notes reviewed.  Pt presented to the ED with similar requests in January, February March of this year.    Home Medications Prior to Admission medications   Medication Sig Start Date End Date Taking? Authorizing Provider  amoxicillin (AMOXIL) 500 MG capsule Take 1 capsule (500 mg total) by mouth 3 (three) times daily. 04/16/23  Yes Linwood Dibbles, MD  dicyclomine (BENTYL) 20 MG tablet Take 1 tablet (20 mg total) by mouth 2 (two) times daily. 04/16/23  Yes Linwood Dibbles, MD  methocarbamol (ROBAXIN) 500 MG tablet Take 1 tablet (500 mg total) by mouth 2 (two) times daily. 04/16/23  Yes Linwood Dibbles, MD  ondansetron (ZOFRAN) 4 MG tablet Take 1 tablet (4 mg total) by mouth every 6 (six) hours. 04/16/23  Yes Linwood Dibbles, MD  acetaminophen (TYLENOL) 500 MG tablet Take 1,000 mg by mouth every 6 (six) hours as needed for moderate pain.    [provider]  diazePAM (VALIUM PO) Take 1 tablet by mouth in the morning and at bedtime. For thyroid tremors.    [provider]  hydrOXYzine (ATARAX) 25 MG tablet Take 0.5-1 tablets (12.5-25 mg total) by mouth every 8 (eight) hours as needed for itching. 03/04/22   Wallis Bamberg, PA-C  ibuprofen (ADVIL) 200 MG tablet Take 600 mg by mouth every 6 (six) hours as needed for moderate pain.    [provider]  methadone (DOLOPHINE) 10 MG/ML solution Take 110 mg by mouth daily.    [provider]  metoprolol tartrate (LOPRESSOR) 50 MG tablet Take 1  tablet (50 mg total) by mouth 2 (two) times daily. 03/04/22   Wallis Bamberg, PA-C  Multiple Vitamins-Minerals (HAIR SKIN AND NAILS FORMULA PO) Take 1 tablet by mouth daily.    [provider]  Multiple Vitamins-Minerals (ONE-A-DAY WOMENS PO) Take 1 tablet by mouth daily at 6 (six) AM.    [provider]  naproxen (NAPROSYN) 500 MG tablet Take 1 tablet (500 mg total) by mouth 2 (two) times daily with a meal. 05/15/21   Wallis Bamberg, PA-C  QUEtiapine (SEROQUEL) 300 MG tablet Take 1 tablet (300 mg total) by mouth at bedtime as needed (sleep). 03/04/22   Wallis Bamberg, PA-C  amphetamine-dextroamphetamine (ADDERALL) 20 MG tablet Take 20 mg by mouth daily. 02/02/18 11/22/19  [provider]      Allergies    Amitriptyline, Cephalexin, Lithium, and Miconazole nitrate    Review of Systems   Review of Systems  Physical Exam Updated Vital Signs BP 117/72 (BP Location: Left Arm)   Pulse (!) 59   Temp (!) 97.5 F (36.4 C) (Oral)   Resp 16   Ht 1.651 m (5\' 5" )   Wt 56.7 kg   SpO2 98%   BMI 20.80 kg/m  Physical Exam Vitals and nursing note reviewed.  Constitutional:      General: She is not in acute distress.  Appearance: She is well-developed.  HENT:     Head: Normocephalic and atraumatic.     Right Ear: External ear normal.     Left Ear: External ear normal.  Eyes:     General: No scleral icterus.       Right eye: No discharge.        Left eye: No discharge.     Conjunctiva/sclera: Conjunctivae normal.  Neck:     Trachea: No tracheal deviation.  Cardiovascular:     Rate and Rhythm: Normal rate.  Pulmonary:     Effort: Pulmonary effort is normal. No respiratory distress.     Breath sounds: No stridor.  Abdominal:     General: There is no distension.  Musculoskeletal:        General: No swelling or deformity.     Cervical back: Neck supple.  Skin:    General: Skin is warm and dry.     Findings: No rash.  Neurological:     Mental Status: She is alert. Mental  status is at baseline.     Cranial Nerves: No dysarthria or facial asymmetry.     Motor: No seizure activity.     ED Results / Procedures / Treatments   Labs (all labs ordered are listed, but only abnormal results are displayed) Labs Reviewed - No data to display  EKG None  Radiology No results found.  Procedures Procedures    Medications Ordered in ED Medications  dicyclomine (BENTYL) tablet 20 mg (has no administration in time range)  naproxen (NAPROSYN) tablet 500 mg (has no administration in time range)  ondansetron (ZOFRAN-ODT) disintegrating tablet 4 mg (has no administration in time range)  cloNIDine (CATAPRES) tablet 0.1 mg (has no administration in time range)    ED Course/ Medical Decision Making/ A&P Clinical Course as of 04/16/23 1712  Fri Apr 16, 2023  1643 Discussed with [facility]. They will be open tomorrow.   [JK]    Clinical Course User Index [JK] Linwood Dibbles, MD                             Medical Decision Making  Patient presented to the ED for a dose of her methadone.  Patient states she has a history of methadone use.  Patient states she was out of town and missed her doses at her methadone treatment center.  Patient states she called the center and she was told to come to the emergency room.  I contacted the [facility].  I was informed that the center was closed today but they would be open in the morning.  They did not instruct the patient to come here to get a dose of methadone.  They do instruct the patient that they can come to the ED for treatment if their symptoms if they think it is necessary.  Patient last received a dose of methadone on July 8.  Appears she skipped the ninth 10th 11th and 12th.  I did review the patient's record.  She has been seen several times in the emergency room for missing her methadone doses.  Patient was instructed last time that we cannot continue to do this.  Will provide patient's  symptomatic treatment included Bentyl ondansetron methocarbamol.  Also given a dose of clonidine to help counteract withdrawal symptoms.  Patient will be also given an antibiotic prescription for her dental caries.        Final  Clinical Impression(s) / ED Diagnoses Final diagnoses:  Methadone use  Dental caries    Rx / DC Orders ED Discharge Orders          Ordered    amoxicillin (AMOXIL) 500 MG capsule  3 times daily        04/16/23 1656    dicyclomine (BENTYL) 20 MG tablet  2 times daily        04/16/23 1656    ondansetron (ZOFRAN) 4 MG tablet  Every 6 hours        04/16/23 1656    methocarbamol (ROBAXIN) 500 MG tablet  2 times daily        04/16/23 1656              Linwood Dibbles, MD 04/16/23 1712

## 2023-04-16 NOTE — Discharge Instructions (Addendum)
Please follow-up with your methadone clinic tomorrow to receive treatment.  We open at 5:30 in the morning take the medications prescribed to help with withdrawal symptoms.  Take the antibiotics for your cavities and follow-up with a dentist as soon as possible.

## 2023-04-16 NOTE — ED Triage Notes (Addendum)
Pt req methadone rx, states she has chills and is nauseous. Also c/o right toothache.

## 2024-05-15 NOTE — Progress Notes (Signed)
 ------------------------------------------------------------------------------- Summary: Limited Exam, 1 PA, EXT #2,5,13,30 -------------------------------------------------------------------------------  [facility] 05/15/24 No primary care provider on file. Treatment Providers Alan Daring Kotis, DMD  Dental procedures in this visit  . I9859 - LIMITED ORAL EVALUATION - PROBLEM FOCUSED (Completed)    Service provider: Alan Daring Fairly, DMD    Billing provider: Alan Daring Fairly, DMD  . [phone] - SINGLE X-RAY (Completed)    Service provider: Alan Daring Fairly, DMD    Billing provider: Alan Daring Kotis, DMD  . [phone] - EXTRACTION, ERUPTED TOOTH REQUIRING REMOVAL OF BONE AND/OR SECTIONING OF TOOTH 5 (Completed)    Service provider: Alan Daring Fairly, DMD    Billing provider: Alan Daring Kotis, DMD  . [phone] - EXTRACTION, ERUPTED TOOTH OR EXPOSED ROOT (ELEVATION AND/OR FORCEPS REMOVAL) 2 (Completed)    Service provider: Alan Daring Fairly, DMD    Billing provider: Alan Daring Kotis, DMD  . [phone] - EXTRACTION, ERUPTED TOOTH REQUIRING REMOVAL OF BONE AND/OR SECTIONING OF TOOTH 30 (Completed)    Service provider: Alan Daring Fairly, DMD    Billing provider: Alan Daring Kotis, DMD  . [phone] - EXTRACTION, ERUPTED TOOTH OR EXPOSED ROOT (ELEVATION AND/OR FORCEPS REMOVAL) 13 (Completed)    Service provider: Alan Daring Fairly, DMD    Billing provider: Alan Daring Kotis, DMD    HEALTH HISTORY ? Vitals:  BP Readings from Last 1 Encounters:  05/15/24 108/67    Pulse:  67 Medical history was reviewed and updated. No contraindication to care. Medical History[1] Surgical History[2] Social History   Tobacco Use  . Smoking status: Never  . Smokeless tobacco: Never  Substance Use Topics  . Alcohol use: Never   Family History[3] Medications Ordered Prior to Encounter[4] Medical Risk Assessment ASA GRADE: ASA 1 - Normal health patient  Subjective: Patient  presents with pain on her right side. Patient was seen last week ([facility]) and was recommended RCT/CRNS or EXT.  Patient cannot afford RCT/CRNS would rather have teeth EXT    Objective:  Radiographs taken: none taken today. Patient has xrays on [facility] [facility] from last week.    Assessment: Pt presents to clinic for extraction of teeth #2, #5, #13 & #30 Discussed with patient risks and precautions of procedure. Necessary consent were obtained from patient.  RISKS & LIABILITY GIVEN: Discussed with patient potential risk of procedural complication which may include NA  Paused to confirm correct patient, correct teeth for treatment, and confirmed diagnosis for treatment.  Consent obtained? Yes  Topical (Benzocaine  20%) placed.  ANESTHETIC 1:Septocaine (Articaine hydrochloride 4% w/ epi 1:100K) x Carpules: 4 carpules;  ANESTHETIC 2:Lidocaine  2% epi: 1:100K x Carpules: 2 carpules;  Profound anesthesia achieved. Use of periosteal, luxators and elevators to luxate tooth. Use of forcep to extract tooth. Curetted and Irrigation w/ sterile water and/or 0.12% chlohexidine gluconate of extraction site. Alveolar bone compression applied at site of extraction. Placed moistened 2x2 guaze at extraction site with instructions to remove in 20-30 minutes unless excessive bleeding noted.  Surgical foam/plug placed: NA Surgical procedure: 15 blade used for teeth #5 & #30 to remove excess infection Bonegraft Placed: No Suture type: No Suture Placed No brisk bleeding observed. No observation of buccal or lingual plate fracture. Patient dismissed in stable condition. PRESCRIPTION: Amoxicillin  (see Rx script for additional details) & Ibuprofen    Plan: Discussed with patient the importance of preserving the clot in socket by avoiding for a minimum of 72 hours: spitting, swishing, use of mouthwash, use of peroxide, use of  straws, and smoking.  Reviewed post op instructions and answered all of patient's  questions. Prescribed the following medications: PRESCRIPTION: AMOX & IBUPRO Advised patient, if after being dismissed patient notes excessive bleeding to contact our office immediately or, if unable to reach our office, report to urgent care/emergency room as needed. Patient expressed understanding of all that was discussed. Recommended maxillary partial - pre auth to be sent today  Highly recommended for patient to return for cleaning (FMD).  When taking impression we need to do restorations or EXT #10 - patient aware   NV: FMD and or impressions & restorations    Treatment Providers Dental Assistant: Kelsey Alana Alan Delores Linzy, DMD [facility] [address] [place] [id number] [phone]         [1] History reviewed. No pertinent past medical history. [2] History reviewed. No pertinent surgical history. [3] No family history on file. [4] Current Outpatient Medications on File Prior to Visit  Medication Sig Dispense Refill  . acetaminophen  (TYLENOL ) 500 mg tablet Take 1,000 mg by mouth.    . amoxicillin  (AMOXIL ) 500 mg capsule Take 500 mg by mouth.    . diazePAM (VALIUM) 2 mg tablet Take 1 tablet by mouth in the morning and 1 tablet in the evening.    . dicyclomine  (BENTYL ) 20 mg tablet Take 20 mg by mouth.    . ibuprofen  (ADVIL ,MOTRIN ) 200 mg tablet Take 600 mg by mouth.    . [EXPIRED] ibuprofen  (ADVIL ,MOTRIN ) 800 mg tablet Take 1 (ONE) tablet (800 mg total) by mouth every 6 (six) hours if needed for mild pain or moderate pain for up to 5 days. 20 tablet 0  . methadone  (DOLOPHINE ) 10 mg/mL solution Take 110 mg by mouth.    . methocarbamoL  (ROBAXIN ) 500 mg tablet Take 500 mg by mouth.    . multivitamin with minerals tablet Take 1 tablet by mouth 1 (one) time each day.    . ondansetron  (ZOFRAN ) 4 mg tablet Take 4 mg by mouth.     No current facility-administered medications on file prior to  visit.

## 2024-05-17 ENCOUNTER — Encounter (HOSPITAL_COMMUNITY): Payer: Self-pay

## 2024-05-17 ENCOUNTER — Emergency Department (HOSPITAL_COMMUNITY)
Admission: EM | Admit: 2024-05-17 | Discharge: 2024-05-18 | Disposition: A | Discharge status: Other Acute Inpatient Hospital | Payer: MEDICAID | Attending: Emergency Medicine | Admitting: Emergency Medicine

## 2024-05-17 DIAGNOSIS — F39 Unspecified mood [affective] disorder: Secondary | ICD-10-CM | POA: Diagnosis not present

## 2024-05-17 DIAGNOSIS — J45909 Unspecified asthma, uncomplicated: Secondary | ICD-10-CM | POA: Insufficient documentation

## 2024-05-17 DIAGNOSIS — D72829 Elevated white blood cell count, unspecified: Secondary | ICD-10-CM | POA: Insufficient documentation

## 2024-05-17 DIAGNOSIS — R4689 Other symptoms and signs involving appearance and behavior: Secondary | ICD-10-CM | POA: Diagnosis present

## 2024-05-17 DIAGNOSIS — Z0289 Encounter for other administrative examinations: Secondary | ICD-10-CM | POA: Insufficient documentation

## 2024-05-17 DIAGNOSIS — Z87891 Personal history of nicotine dependence: Secondary | ICD-10-CM | POA: Insufficient documentation

## 2024-05-17 DIAGNOSIS — Z008 Encounter for other general examination: Secondary | ICD-10-CM

## 2024-05-17 LAB — COMPREHENSIVE METABOLIC PANEL WITH GFR
ALT: 18 U/L (ref 0–44)
AST: 30 U/L (ref 15–41)
Albumin: 4.4 g/dL (ref 3.5–5.0)
Alkaline Phosphatase: 73 U/L (ref 38–126)
Anion gap: 10 (ref 5–15)
BUN: 13 mg/dL (ref 6–20)
CO2: 21 mmol/L — ABNORMAL LOW (ref 22–32)
Calcium: 9.4 mg/dL (ref 8.9–10.3)
Chloride: 105 mmol/L (ref 98–111)
Creatinine, Ser: 0.89 mg/dL (ref 0.44–1.00)
GFR, Estimated: >60 mL/min (ref >60)
Glucose, Bld: 139 mg/dL — ABNORMAL HIGH (ref 70–99)
Potassium: 3.6 mmol/L (ref 3.5–5.1)
Sodium: 136 mmol/L (ref 135–145)
Total Bilirubin: 0.4 mg/dL (ref 0.0–1.2)
Total Protein: 8 g/dL (ref 6.5–8.1)

## 2024-05-17 LAB — RAPID URINE DRUG SCREEN, HOSP PERFORMED
Amphetamines: NOT DETECTED
Barbiturates: NOT DETECTED
Benzodiazepines: NOT DETECTED
Cocaine: NOT DETECTED
Opiates: NOT DETECTED
Tetrahydrocannabinol: POSITIVE — AB

## 2024-05-17 LAB — CBC
HCT: 40.4 % (ref 36.0–46.0)
Hemoglobin: 13.1 g/dL (ref 12.0–15.0)
MCH: 30.4 pg (ref 26.0–34.0)
MCHC: 32.4 g/dL (ref 30.0–36.0)
MCV: 93.7 fL (ref 80.0–100.0)
Platelets: 309 K/uL (ref 150–400)
RBC: 4.31 MIL/uL (ref 3.87–5.11)
RDW: 13.4 % (ref 11.5–15.5)
WBC: 13.2 K/uL — ABNORMAL HIGH (ref 4.0–10.5)
nRBC: 0 % (ref 0.0–0.2)

## 2024-05-17 LAB — ETHANOL: Alcohol, Ethyl (B): <15 mg/dL (ref <15)

## 2024-05-17 LAB — HCG, SERUM, QUALITATIVE: Preg, Serum: NEGATIVE

## 2024-05-17 MED ORDER — LOPERAMIDE HCL 2 MG PO CAPS: 2.0000 mg | ORAL_CAPSULE | ORAL | Status: DC | PRN

## 2024-05-17 MED ORDER — METHOCARBAMOL 500 MG PO TABS: 500.0000 mg | ORAL_TABLET | Freq: Three times a day (TID) | ORAL | Status: DC | PRN

## 2024-05-17 MED ORDER — DICYCLOMINE HCL 20 MG PO TABS: 20.0000 mg | ORAL_TABLET | Freq: Four times a day (QID) | ORAL | Status: DC | PRN

## 2024-05-17 MED ORDER — OLANZAPINE 5 MG PO TBDP
5.0000 mg | ORAL_TABLET | Freq: Four times a day (QID) | ORAL | Status: DC | PRN
  Administered 2024-05-17 (×2): 5 mg via ORAL
  Filled 2024-05-17: qty 1

## 2024-05-17 MED ORDER — DIVALPROEX SODIUM ER 250 MG PO TB24
250.0000 mg | ORAL_TABLET | Freq: Every day | ORAL | Status: DC
  Administered 2024-05-17 (×2): 250 mg via ORAL
  Filled 2024-05-17: qty 1

## 2024-05-17 MED ORDER — ALUM & MAG HYDROXIDE-SIMETH 200-200-20 MG/5ML PO SUSP: 30.0000 mL | Freq: Four times a day (QID) | ORAL | Status: DC | PRN

## 2024-05-17 MED ORDER — ONDANSETRON HCL 4 MG PO TABS: 4.0000 mg | ORAL_TABLET | Freq: Three times a day (TID) | ORAL | Status: DC | PRN

## 2024-05-17 MED ORDER — OLANZAPINE 10 MG IM SOLR: 10.0000 mg | Freq: Once | INTRAMUSCULAR | Status: DC | PRN

## 2024-05-17 MED ORDER — METHADONE HCL 10 MG/ML PO CONC
110.0000 mg | Freq: Every day | ORAL | Status: DC
  Administered 2024-05-17 – 2024-05-18 (×3): 110 mg via ORAL
  Filled 2024-05-17 (×2): qty 15

## 2024-05-17 MED ORDER — HYDROXYZINE HCL 25 MG PO TABS
25.0000 mg | ORAL_TABLET | Freq: Four times a day (QID) | ORAL | Status: DC | PRN
  Administered 2024-05-17 – 2024-05-18 (×7): 25 mg via ORAL
  Filled 2024-05-17 (×4): qty 1

## 2024-05-17 MED ORDER — NAPROXEN 500 MG PO TABS
500.0000 mg | ORAL_TABLET | Freq: Two times a day (BID) | ORAL | Status: DC | PRN
  Administered 2024-05-17 (×2): 500 mg via ORAL
  Filled 2024-05-17: qty 1

## 2024-05-17 MED ORDER — LORAZEPAM 1 MG PO TABS
1.0000 mg | ORAL_TABLET | Freq: Once | ORAL | Status: AC
  Administered 2024-05-17 (×2): 1 mg via ORAL
  Filled 2024-05-17: qty 1

## 2024-05-17 MED ORDER — ACETAMINOPHEN 325 MG PO TABS
650.0000 mg | ORAL_TABLET | ORAL | Status: DC | PRN
  Administered 2024-05-17 (×4): 650 mg via ORAL
  Filled 2024-05-17 (×2): qty 2

## 2024-05-17 NOTE — ED Notes (Signed)
 Pt accepted to [facility].
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
## 2024-05-17 NOTE — ED Notes (Signed)
 Pt moved to [facility] with sitter.

## 2024-05-17 NOTE — ED Notes (Signed)
 Sheriff called and transport arranged. Sheriff advised transport would come tomorrow
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
## 2024-05-17 NOTE — ED Notes (Signed)
 Pt currently in consultation with NP Kyra. As soon as their meeting is done pt will be moved to [facility]

## 2024-05-17 NOTE — Progress Notes (Signed)
 Pt has been accepted to [facility] 05/17/2024 Bed assignment: Main campus  Pt meets inpatient criteria per Caitlin Patient, NP   Attending Physician will be Millie Manners, MD  Report can be called to: [phone] (this is a pager, please leave call-back number when giving report)  Pt can arrive after asap  Care Team Notified: Norlene Levels, RN,  Caitlin Patient, NP

## 2024-05-17 NOTE — Progress Notes (Signed)
 Inpatient Psychiatric Referral  Patient was recommended inpatient per Kyra Weber, NP. There are no available beds at [facility], per [facility] .Patient was referred to the following out of network facilities:  Destination  Service Provider Request Status Address Phone Fax  [facility]  Pending - Request Sent [address], [place] [address] [phone]  [facility]  Pending - Request Sent [address] [place] [address] [phone]  [facility]  Pending - Request Sent [address] [place] [address] [phone]  [facility]  Pending - Request Sent [address] [place] [address] [phone]  [facility]  Pending - Request Sent [address] [place] [address] [phone]  [facility]  Pending - Request Sent [address] [place] [address] [phone]  [facility]  Pending - Request Sent [address] [place] [phone]  [facility]  Pending - Request Sent [address] [place] [address] [phone]  [facility]  Pending - Request Sent [address] [place] [address] [phone]    Situation ongoing, CSW to continue following and update chart as more information becomes available.   Harrie Sofia MSW, LCSWA 05/17/2024  2:45PM
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
## 2024-05-17 NOTE — BH Assessment (Signed)
 Clinician spoke to with [facility] to complete pt's TTS assessment. Clinician provided pt's name, MRN, location, age, room number and provider's name Secure message completed.    [facility] coordinator to update secure chat when assessment time and provider are assigned.   Redmond Pulling, MS, [facility], [facility] Triage Specialist [phone]
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
## 2024-05-17 NOTE — ED Notes (Signed)
 PT says she is unable to complete urine sample at this time because she was only able to have a bowel movement & gave a little amount of urine in cup; says she is willing to try again
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
## 2024-05-17 NOTE — ED Notes (Signed)
 PT belongings placed into patient belongings bag labeled with pt stickers and placed into cabinet labeled 1-4 above nurse's station
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
## 2024-05-17 NOTE — ED Notes (Signed)
 Pt has removed 2 white toned rings with stones in one and a single clear stone in the other one, string bracelet removed. Pt is unable to remove necklace due to tied in a knot as closure. The are labeled and placed in personal belongings bag with her other items

## 2024-05-17 NOTE — Group Note (Deleted)
 Date:  05/17/2024 Time:  2:22 PM  Group Topic/Focus:  Wellness Toolbox:   The focus of this group is to discuss various aspects of wellness, balancing those aspects and exploring ways to increase the ability to experience wellness.  Patients will create a wellness toolbox for use upon discharge.     Participation Level:  {BHH PARTICIPATION OZCZO:77735}  Participation Quality:  {BHH PARTICIPATION QUALITY:22265}  Affect:  {BHH AFFECT:22266}  Cognitive:  {BHH COGNITIVE:22267}  Insight: {BHH Insight2:20797}  Engagement in Group:  {BHH ENGAGEMENT IN HMNLE:77731}  Modes of Intervention:  {BHH MODES OF INTERVENTION:22269}  Additional Comments:  ***  Caitlin Berger 05/17/2024, 2:22 PM

## 2024-05-17 NOTE — ED Notes (Signed)
 Pt requested to make phone call. Given portable from Estate agent.

## 2024-05-17 NOTE — ED Notes (Signed)
 Juliene Thaddus (unsure of spelling) called and wanted to give information on pt. Writer told pt she would update the NP.   Thaddus [phone]  Alt phone: [phone]

## 2024-05-18 NOTE — ED Notes (Signed)
 Patient asked for something to help calm her

## 2024-05-18 NOTE — ED Provider Notes (Signed)
 Emergency Medicine Observation Re-evaluation Note  Caitlin Berger is a 38 y.o. female, seen on rounds today.  Pt initially presented to the ED for complaints of No chief complaint on file. Currently, the patient is sleeping.  Physical Exam  BP (!) 113/43 (BP Location: Right Arm)   Pulse (!) 50   Temp 98 F (36.7 C) (Oral)   Resp 18   SpO2 98%  Physical Exam General: No acute distress Cardiac: Regular Lungs: Clear Psych: Currently under IVC but cooperative  ED Course / MDM  EKG:   I have reviewed the labs performed to date as well as medications administered while in observation.  Recent changes in the last 24 hours include evaluated by TTS and felt to be inpatient.  Plan  Current plan is inpatient psychiatry waiting on placement.    Doretha Folks, MD 05/18/24 [phone]

## 2024-05-18 NOTE — ED Notes (Addendum)
 [facility] called pts mother, Caitlin Berger to update her that pt in being transferred and admitted to [facility]. [facility] left a HIPAA compliant message to return the call.   Pts mother returned the call to [facility], the address and contact information for [facility] was provided.   Chesley Holt, [facility]  05/18/24

## 2024-05-18 NOTE — ED Notes (Signed)
 [employer] was called to confirm transport    they have her on the list but couldn't provide an estimated time of arrival
